# Patient Record
Sex: Female | Born: 1965 | Hispanic: Yes | Marital: Married | Smoking: Former smoker
Health system: Southern US, Community
[De-identification: ages and names within clinical notes are randomized; demographics above are authoritative.]

## PROBLEM LIST (undated history)

## (undated) DIAGNOSIS — D649 Anemia, unspecified: Secondary | ICD-10-CM

## (undated) DIAGNOSIS — M1990 Unspecified osteoarthritis, unspecified site: Secondary | ICD-10-CM

## (undated) HISTORY — PX: OTHER SURGICAL HISTORY: SHX169

## (undated) HISTORY — PX: BREAST REDUCTION SURGERY: SHX8

## (undated) HISTORY — PX: BREAST BIOPSY: SHX20

## (undated) HISTORY — PX: APPENDECTOMY: SHX54

## (undated) HISTORY — PX: WISDOM TOOTH EXTRACTION: SHX21

## (undated) HISTORY — PX: JOINT REPLACEMENT: SHX530

---

## 2018-02-22 DIAGNOSIS — G8929 Other chronic pain: Secondary | ICD-10-CM | POA: Insufficient documentation

## 2018-03-30 DIAGNOSIS — M25552 Pain in left hip: Secondary | ICD-10-CM | POA: Insufficient documentation

## 2018-07-05 DIAGNOSIS — M25859 Other specified joint disorders, unspecified hip: Secondary | ICD-10-CM | POA: Insufficient documentation

## 2018-09-29 DIAGNOSIS — D72829 Elevated white blood cell count, unspecified: Secondary | ICD-10-CM | POA: Insufficient documentation

## 2018-09-29 DIAGNOSIS — R079 Chest pain, unspecified: Secondary | ICD-10-CM | POA: Insufficient documentation

## 2018-09-29 DIAGNOSIS — I10 Essential (primary) hypertension: Secondary | ICD-10-CM | POA: Insufficient documentation

## 2019-10-19 ENCOUNTER — Ambulatory Visit (INDEPENDENT_AMBULATORY_CARE_PROVIDER_SITE_OTHER): Payer: Managed Care, Other (non HMO) | Admitting: Orthopaedic Surgery

## 2019-10-19 ENCOUNTER — Ambulatory Visit (INDEPENDENT_AMBULATORY_CARE_PROVIDER_SITE_OTHER): Payer: Managed Care, Other (non HMO)

## 2019-10-19 VITALS — Ht 59.0 in | Wt 140.0 lb

## 2019-10-19 DIAGNOSIS — M1611 Unilateral primary osteoarthritis, right hip: Secondary | ICD-10-CM | POA: Insufficient documentation

## 2019-10-19 DIAGNOSIS — M1612 Unilateral primary osteoarthritis, left hip: Secondary | ICD-10-CM | POA: Diagnosis not present

## 2019-10-19 DIAGNOSIS — M25559 Pain in unspecified hip: Secondary | ICD-10-CM

## 2019-10-19 NOTE — Progress Notes (Signed)
Office Visit Note   Patient: Joyce Alvarez           Date of Birth: [birth year]           MRN: [id number] Visit Date: 10/19/2019              Requested by: No referring provider defined for this encounter. PCP: [facility]   Assessment & Plan: Visit Diagnoses:  1. Hip pain   2. Unilateral primary osteoarthritis, left hip   3. Unilateral primary osteoarthritis, right hip     Plan: I talked to the patient in detail but her x-rays and her clinical exam.  I am recommending a left total hip arthroplasty through direct anterior approach.  I talked in detail about what the surgery involves.  We discussed the risk and benefits of surgeries.  We talked about her nonoperative and operative course.  We talked about the risk and benefits of surgery in detail in terms of what to expect.  Given the failure of conservative treatment over 12 months and given recommendations by another surgeon as well as needed hip replacement be useful and helpful, I feel that this is a neck step for her as does she.  Given her x-ray findings and her clinical exam she is having enough pain and discomfort to warrant this surgery.  All question concerns were answered and addressed.  We will work on getting it scheduled in the near future hopefully.  Follow-Up Instructions: Return for 2 weeks post-op.   Orders:  Orders Placed This Encounter  Procedures  . XR HIPS BILAT W OR W/O PELVIS 2V   No orders of the defined types were placed in this encounter.     Procedures: No procedures performed   Clinical Data: No additional findings.   Subjective: Chief Complaint  Patient presents with  . Left Hip - Pain  . Right Hip - Pain  The patient is from seeing for the first time.  She has bilateral hip pain with the left worse than right.  This is mainly in the groin on both sides.  She has a history of a left hip arthroscopy with a labral repair done in March 2020.  She has had worsening pain  since then.  She saw her orthopedic surgeon in [place] where she is from.  At this point he recommended hip replacement.  She is come to see me all the way from [place] since I have operated on a friend of hers.  She is not a diabetic and not a smoker.  She is very active but only 54 years old.  Her pain is daily and at this point is detrimentally affecting her mobility, her quality of life and her actives daily living.  She has failed conservative treatment for well over a year now.  Both hips hurt with weightbearing activities.  Both hips hurt with putting her shoes and socks on and with pivoting.  HPI  Review of Systems There is currently no listed headache, chest pain, shortness of breath, fever, chills, nausea, vomiting  Objective: Vital Signs: Ht 4\' 11"  (1.499 m)   Wt 140 lb (63.5 kg)   BMI 28.28 kg/m   Physical Exam She is alert and orient x3 and in no acute distress Ortho Exam Examination of both hips shows significant pain in the groin with internal and external rotation as well as stiffness with rotation. Specialty Comments:  No specialty comments available.  Imaging: XR HIPS BILAT W OR W/O PELVIS  2V  Result Date: 10/19/2019 An AP pelvis and lateral of both hips shows arthritic changes in both hips with the left worse than the right.  Both sides have peritracheal osteophytes with joint space narrowing.  The lateral aspect of the left hip shows a large particular osteophyte at the femoral head with some slight flattening as well.    PMFS History: Patient Active Problem List   Diagnosis Date Noted  . Unilateral primary osteoarthritis, left hip 10/19/2019  . Unilateral primary osteoarthritis, right hip 10/19/2019   No past medical history on file.  No family history on file.   Social History   Occupational History  . Not on file  Tobacco Use  . Smoking status: Not on file  Substance and Sexual Activity  . Alcohol use: Not on file  . Drug use: Not on file  . Sexual  activity: Not on file

## 2019-10-20 ENCOUNTER — Other Ambulatory Visit: Payer: Self-pay

## 2019-11-08 NOTE — Patient Instructions (Addendum)
DUE TO COVID-19 ONLY ONE VISITOR IS ALLOWED TO COME WITH YOU AND STAY IN THE WAITING ROOM ONLY DURING PRE  OP AND PROCEDURE.   IF YOU WILL BE ADMITTED INTO THE HOSPITAL YOU ARE ALLOWED ONE SUPPORT PERSON DURING VISITATION HOURS ONLY  (10AM -8PM)   . The support person may change daily. . The support person must pass our screening, gel in and out, and wear a mask at all times, including in the patient's room. . Patients must also wear a mask when staff or their support person are in the room.   COVID SWAB TESTING MUST BE COMPLETED ON:  Tuesday, 11-15-19 @  11:00 AM    [address] [place], [place] [address]  (Must self quarantine after testing. Follow instructions on handout.)   Your procedure is scheduled on: Friday, 11-18-2019   Report to [facility] Main  Entrance   Report to admitting at 9:45 AM   Call this number if you have problems the morning of surgery [phone]   Do not eat food :After Midnight.   May have liquids until 9:00 AM  day of surgery  CLEAR LIQUID DIET  Foods Allowed                                                                     Foods Excluded  Water, Black Coffee and tea, regular and decaf             liquids that you cannot  Plain Jell-O in any flavor  (No red)                                    see through such as: Fruit ices (not with fruit pulp)                                      milk, soups, orange juice              Iced Popsicles (No red)                                      All solid food                                   Apple juices Sports drinks like Gatorade (No red) Lightly seasoned clear broth or consume(fat free) Sugar, honey syrup      Oral Hygiene is also important to reduce your risk of infection.                                    Remember - BRUSH YOUR TEETH THE MORNING OF SURGERY WITH YOUR REGULAR TOOTHPASTE   Do NOT smoke after Midnight   Take these medicines the morning of surgery with A SIP OF WATER:  None  You may not have any metal on your body including hair pins, jewelry, and body piercings             Do not wear make-up, lotions, powders, perfumes/cologne, or deodorant             Do not wear nail polish.  Do not shave  48 hours prior to surgery.                 Do not bring valuables to the hospital. [facility] IS NOT RESPONSIBLE   FOR VALUABLES.   Contacts, dentures or bridgework may not be worn into surgery.   Bring small overnight bag day of surgery.                 Please read over the following fact sheets you were given: IF YOU HAVE QUESTIONS ABOUT YOUR PRE OP INSTRUCTIONS PLEASE  CALL [phone]   [facility] - Preparing for Surgery Before surgery, you can play an important role.  Because skin is not sterile, your skin needs to be as free of germs as possible.  You can reduce the number of germs on your skin by washing with CHG (chlorahexidine gluconate) soap before surgery.  CHG is an antiseptic cleaner which kills germs and bonds with the skin to continue killing germs even after washing. Please DO NOT use if you have an allergy to CHG or antibacterial soaps.  If your skin becomes reddened/irritated stop using the CHG and inform your nurse when you arrive at Short Stay. Do not shave (including legs and underarms) for at least 48 hours prior to the first CHG shower.  You may shave your face/neck.  Please follow these instructions carefully:  1.  Shower with CHG Soap the night before surgery and the  morning of surgery.  2.  If you choose to wash your hair, wash your hair first as usual with your normal  shampoo.  3.  After you shampoo, rinse your hair and body thoroughly to remove the shampoo.                             4.  Use CHG as you would any other liquid soap.  You can apply chg directly to the skin and wash.  Gently with a scrungie or clean washcloth.  5.  Apply the CHG Soap to your body ONLY FROM THE NECK DOWN.   Do   not use on face/  open                           Wound or open sores. Avoid contact with eyes, ears mouth and   genitals (private parts).                       Wash face,  Genitals (private parts) with your normal soap.             6.  Wash thoroughly, paying special attention to the area where your    surgery  will be performed.  7.  Thoroughly rinse your body with warm water from the neck down.  8.  DO NOT shower/wash with your normal soap after using and rinsing off the CHG Soap.                9.  Pat yourself dry with a clean towel.  10.  Wear clean pajamas.            11.  Place clean sheets on your bed the night of your first shower and do not  sleep with pets. Day of Surgery : Do not apply any lotions/deodorants the morning of surgery.  Please wear clean clothes to the hospital/surgery center.  FAILURE TO FOLLOW THESE INSTRUCTIONS MAY RESULT IN THE CANCELLATION OF YOUR SURGERY  PATIENT SIGNATURE_________________________________  NURSE SIGNATURE__________________________________  ________________________________________________________________________

## 2019-11-08 NOTE — Progress Notes (Addendum)
COVID Vaccine Completed: x2 Date COVID Vaccine completed: 06-03-19 & 06-22-19  COVID vaccine manufacturer: Pfizer    Moderna   [employer]   PCP - No regular PCP Cardiologist - N/A  Chest x-ray -  EKG -  Stress Test -  ECHO - Stress Echo 09-30-2018 due to chest pain.  Workup neg, attributed to stress Cardiac Cath -   Sleep Study -  CPAP -   Fasting Blood Sugar -  Checks Blood Sugar _____ times a day  Blood Thinner Instructions: Aspirin Instructions: Last Dose:  Anesthesia review:   Patient denies shortness of breath, fever, cough and chest pain at PAT appointment   Patient verbalized understanding of instructions that were given to them at the PAT appointment. Patient was also instructed that they will need to review over the PAT instructions again at home before surgery.

## 2019-11-08 NOTE — Progress Notes (Signed)
Please enter orders for PAT visit 11-11-19.  Patient scheduled for surgery 11-18-19.  Thank you

## 2019-11-11 ENCOUNTER — Other Ambulatory Visit: Payer: Self-pay

## 2019-11-11 ENCOUNTER — Encounter (HOSPITAL_COMMUNITY)
Admission: RE | Admit: 2019-11-11 | Discharge: 2019-11-11 | Disposition: A | Discharge status: Home / Self Care | Payer: Managed Care, Other (non HMO) | Source: Ambulatory Visit | Attending: Orthopaedic Surgery | Admitting: Orthopaedic Surgery

## 2019-11-11 ENCOUNTER — Encounter (HOSPITAL_COMMUNITY): Payer: Self-pay

## 2019-11-11 DIAGNOSIS — Z01812 Encounter for preprocedural laboratory examination: Secondary | ICD-10-CM | POA: Insufficient documentation

## 2019-11-11 HISTORY — DX: Anemia, unspecified: D64.9

## 2019-11-11 HISTORY — DX: Unspecified osteoarthritis, unspecified site: M19.90

## 2019-11-11 LAB — CBC
HCT: 39.8 % (ref 36.0–46.0)
Hemoglobin: 13 g/dL (ref 12.0–15.0)
MCH: 30.2 pg (ref 26.0–34.0)
MCHC: 32.7 g/dL (ref 30.0–36.0)
MCV: 92.6 fL (ref 80.0–100.0)
Platelets: 345 10*3/uL (ref 150–400)
RBC: 4.3 MIL/uL (ref 3.87–5.11)
RDW: 13 % (ref 11.5–15.5)
WBC: 12.4 10*3/uL — ABNORMAL HIGH (ref 4.0–10.5)
nRBC: 0 % (ref 0.0–0.2)

## 2019-11-11 LAB — SURGICAL PCR SCREEN
MRSA, PCR: NEGATIVE
Staphylococcus aureus: NEGATIVE

## 2019-11-11 NOTE — Progress Notes (Signed)
CBC sent to Dr. Blackman for review. 

## 2019-11-14 ENCOUNTER — Telehealth: Payer: Self-pay

## 2019-11-14 NOTE — Telephone Encounter (Signed)
Email from patient:  Hi! I am looking forward to my surgery Friday.  Both my hips are in bad shape, I was wondering if it will make sense to get an injection on the right hip so when the pressure of recovery (left hip) does not make it more difficult or have a more severe impact   I had an injection on my left hip early this year and it did have a temporary effect (not the Full 3 months they promised)   Can you consult with the doctor ?  Thanks    Zarya E. Bacchi [phone]

## 2019-11-14 NOTE — Telephone Encounter (Signed)
I am fine with adding to her consent an intra-articular steroid injection in her left hip that we can do in the OR under live x-ray.

## 2019-11-15 ENCOUNTER — Other Ambulatory Visit (HOSPITAL_COMMUNITY)
Admission: RE | Admit: 2019-11-15 | Discharge: 2019-11-15 | Disposition: A | Discharge status: Home / Self Care | Payer: Managed Care, Other (non HMO) | Source: Ambulatory Visit | Attending: Orthopaedic Surgery | Admitting: Orthopaedic Surgery

## 2019-11-15 ENCOUNTER — Other Ambulatory Visit: Payer: Self-pay | Admitting: Physician Assistant

## 2019-11-15 DIAGNOSIS — Z20822 Contact with and (suspected) exposure to covid-19: Secondary | ICD-10-CM | POA: Diagnosis not present

## 2019-11-15 DIAGNOSIS — Z01812 Encounter for preprocedural laboratory examination: Secondary | ICD-10-CM | POA: Diagnosis not present

## 2019-11-15 LAB — SARS CORONAVIRUS 2 (TAT 6-24 HRS): SARS Coronavirus 2: NEGATIVE

## 2019-11-15 NOTE — Telephone Encounter (Signed)
I added to surgery posting.  Joyce Alvarez will add to consent.

## 2019-11-17 ENCOUNTER — Encounter (HOSPITAL_COMMUNITY): Payer: Self-pay | Admitting: Orthopaedic Surgery

## 2019-11-17 NOTE — H&P (Signed)
TOTAL HIP ADMISSION H&P  Patient is admitted for left total hip arthroplasty.  Subjective:  Chief Complaint: left hip pain  HPI: Joyce Alvarez, 54 y.o. female, has a history of pain and functional disability in the left hip(s) due to arthritis and patient has failed non-surgical conservative treatments for greater than 12 weeks to include NSAID's and/or analgesics, corticosteriod injections, flexibility and strengthening excercises and activity modification.  Onset of symptoms was gradual starting 2 years ago with gradually worsening course since that time.The patient noted prior procedures of the hip to include arthroscopy on the left hip(s).  Patient currently rates pain in the left hip at 10 out of 10 with activity. Patient has night pain, worsening of pain with activity and weight bearing, pain that interfers with activities of daily living and pain with passive range of motion. Patient has evidence of subchondral cysts, subchondral sclerosis, periarticular osteophytes and joint space narrowing by imaging studies. This condition presents safety issues increasing the risk of falls.  There is no current active infection.  Patient Active Problem List   Diagnosis Date Noted  . Unilateral primary osteoarthritis, left hip 10/19/2019  . Unilateral primary osteoarthritis, right hip 10/19/2019   Past Medical History:  Diagnosis Date  . Anemia   . Arthritis     Past Surgical History:  Procedure Laterality Date  . APPENDECTOMY    . BREAST BIOPSY Right   . BREAST REDUCTION SURGERY    . caesarean section    . WISDOM TOOTH EXTRACTION      No current facility-administered medications for this encounter.   Current Outpatient Medications  Medication Sig Dispense Refill Last Dose  . acidophilus (RISAQUAD) CAPS capsule Take 1 capsule by mouth daily.     . Cholecalciferol (VITAMIN D) 50 MCG (2000 UT) CAPS Take 2,000 Units by mouth daily.     . COLLAGEN PO Take 1 capsule by mouth daily.     .  Glucosamine-Chondroitin (OSTEO BI-FLEX REGULAR STRENGTH PO) Take 2 tablets by mouth daily.     . Magnesium 250 MG TABS Take 250 mg by mouth daily.     . Multiple Vitamin (MULTIVITAMIN WITH MINERALS) TABS tablet Take 1 tablet by mouth daily.     . Omega-3 1000 MG CAPS Take 1,000 mg by mouth daily.      No Known Allergies  Social History   Tobacco Use  . Smoking status: Former Smoker    Packs/day: 0.25    Years: 10.00    Pack years: 2.50  . Smokeless tobacco: Never Used  . Tobacco comment: Quit 1995  Substance Use Topics  . Alcohol use: Yes    Comment: Social    History reviewed. No pertinent family history.   Review of Systems  All other systems reviewed and are negative.   Objective:  Physical Exam Vitals reviewed.  Constitutional:      Appearance: Normal appearance.  HENT:     Head: Normocephalic and atraumatic.  Eyes:     Extraocular Movements: Extraocular movements intact.     Pupils: Pupils are equal, round, and reactive to light.  Cardiovascular:     Rate and Rhythm: Normal rate and regular rhythm.     Pulses: Normal pulses.  Pulmonary:     Effort: Pulmonary effort is normal.     Breath sounds: Normal breath sounds.  Abdominal:     Palpations: Abdomen is soft.  Musculoskeletal:     Cervical back: Normal range of motion.     Left hip: Tenderness and  bony tenderness present. Decreased range of motion. Decreased strength.  Neurological:     Mental Status: She is alert and oriented to person, place, and time.  Psychiatric:        Behavior: Behavior normal.     Vital signs in last 24 hours:    Labs:   Estimated body mass index is 31.1 kg/m as calculated from the following:   Height as of 11/11/19: 4\' 11"  (1.499 m).   Weight as of 11/11/19: 69.9 kg.   Imaging Review Plain radiographs demonstrate severe degenerative joint disease of the left hip(s). The bone quality appears to be excellent for age and reported activity  level.      Assessment/Plan:  End stage arthritis, left hip(s)  The patient history, physical examination, clinical judgement of the provider and imaging studies are consistent with end stage degenerative joint disease of the left hip(s) and total hip arthroplasty is deemed medically necessary. The treatment options including medical management, injection therapy, arthroscopy and arthroplasty were discussed at length. The risks and benefits of total hip arthroplasty were presented and reviewed. The risks due to aseptic loosening, infection, stiffness, dislocation/subluxation,  thromboembolic complications and other imponderables were discussed.  The patient acknowledged the explanation, agreed to proceed with the plan and consent was signed. Patient is being admitted for inpatient treatment for surgery, pain control, PT, OT, prophylactic antibiotics, VTE prophylaxis, progressive ambulation and ADL's and discharge planning.The patient is planning to be discharged home with home health services

## 2019-11-18 ENCOUNTER — Encounter (HOSPITAL_COMMUNITY): Payer: Self-pay | Admitting: Orthopaedic Surgery

## 2019-11-18 ENCOUNTER — Other Ambulatory Visit: Payer: Self-pay

## 2019-11-18 ENCOUNTER — Observation Stay (HOSPITAL_COMMUNITY): Payer: Managed Care, Other (non HMO)

## 2019-11-18 ENCOUNTER — Ambulatory Visit (HOSPITAL_COMMUNITY): Payer: Managed Care, Other (non HMO) | Admitting: Anesthesiology

## 2019-11-18 ENCOUNTER — Observation Stay (HOSPITAL_COMMUNITY)
Admission: RE | Admit: 2019-11-18 | Discharge: 2019-11-19 | Disposition: A | Discharge status: Home / Self Care | Payer: Managed Care, Other (non HMO) | Source: Ambulatory Visit | Attending: Orthopaedic Surgery | Admitting: Orthopaedic Surgery

## 2019-11-18 ENCOUNTER — Encounter (HOSPITAL_COMMUNITY)
Admission: RE | Disposition: A | Discharge status: Home / Self Care | Payer: Self-pay | Source: Ambulatory Visit | Attending: Orthopaedic Surgery

## 2019-11-18 ENCOUNTER — Ambulatory Visit (HOSPITAL_COMMUNITY): Payer: Managed Care, Other (non HMO)

## 2019-11-18 DIAGNOSIS — Z87891 Personal history of nicotine dependence: Secondary | ICD-10-CM | POA: Insufficient documentation

## 2019-11-18 DIAGNOSIS — Z419 Encounter for procedure for purposes other than remedying health state, unspecified: Secondary | ICD-10-CM

## 2019-11-18 DIAGNOSIS — Z96642 Presence of left artificial hip joint: Secondary | ICD-10-CM

## 2019-11-18 DIAGNOSIS — M1612 Unilateral primary osteoarthritis, left hip: Principal | ICD-10-CM | POA: Insufficient documentation

## 2019-11-18 HISTORY — PX: STERIOD INJECTION: SHX5046

## 2019-11-18 HISTORY — PX: TOTAL HIP ARTHROPLASTY: SHX124

## 2019-11-18 LAB — TYPE AND SCREEN
ABO/RH(D): O POS
Antibody Screen: NEGATIVE

## 2019-11-18 LAB — ABO/RH: ABO/RH(D): O POS

## 2019-11-18 LAB — PREGNANCY, URINE: Preg Test, Ur: NEGATIVE

## 2019-11-18 SURGERY — ARTHROPLASTY, HIP, TOTAL, ANTERIOR APPROACH
Anesthesia: Spinal | Site: Hip | Laterality: Right

## 2019-11-18 MED ORDER — BUPIVACAINE IN DEXTROSE 0.75-8.25 % IT SOLN
INTRATHECAL | Status: DC | PRN
  Administered 2019-11-18: 1.8 mL via INTRATHECAL

## 2019-11-18 MED ORDER — SODIUM CHLORIDE 0.9 % IR SOLN
Status: DC | PRN
  Administered 2019-11-18: 1000 mL

## 2019-11-18 MED ORDER — HYDROMORPHONE HCL 1 MG/ML IJ SOLN
0.2500 mg | INTRAMUSCULAR | Status: DC | PRN
  Administered 2019-11-18 (×2): 0.5 mg via INTRAVENOUS

## 2019-11-18 MED ORDER — HYDROMORPHONE HCL 1 MG/ML IJ SOLN
INTRAMUSCULAR | Status: AC
  Filled 2019-11-18: qty 1

## 2019-11-18 MED ORDER — ONDANSETRON HCL 4 MG/2ML IJ SOLN
INTRAMUSCULAR | Status: DC | PRN
  Administered 2019-11-18: 4 mg via INTRAVENOUS

## 2019-11-18 MED ORDER — STERILE WATER FOR IRRIGATION IR SOLN
Status: DC | PRN
  Administered 2019-11-18: 2000 mL

## 2019-11-18 MED ORDER — PANTOPRAZOLE SODIUM 40 MG PO TBEC
40.0000 mg | DELAYED_RELEASE_TABLET | Freq: Every day | ORAL | Status: DC
  Administered 2019-11-18 – 2019-11-19 (×2): 40 mg via ORAL
  Filled 2019-11-18 (×2): qty 1

## 2019-11-18 MED ORDER — VITAMIN D 50 MCG (2000 UT) PO CAPS: 2000.0000 [IU] | ORAL_CAPSULE | Freq: Every day | ORAL | Status: DC

## 2019-11-18 MED ORDER — DIPHENHYDRAMINE HCL 12.5 MG/5ML PO ELIX: 12.5000 mg | ORAL_SOLUTION | ORAL | Status: DC | PRN

## 2019-11-18 MED ORDER — DOCUSATE SODIUM 100 MG PO CAPS
100.0000 mg | ORAL_CAPSULE | Freq: Two times a day (BID) | ORAL | Status: DC
  Administered 2019-11-18 – 2019-11-19 (×2): 100 mg via ORAL
  Filled 2019-11-18 (×2): qty 1

## 2019-11-18 MED ORDER — FENTANYL CITRATE (PF) 100 MCG/2ML IJ SOLN
INTRAMUSCULAR | Status: AC
  Filled 2019-11-18: qty 2

## 2019-11-18 MED ORDER — PROMETHAZINE HCL 25 MG/ML IJ SOLN: 6.2500 mg | INTRAMUSCULAR | Status: DC | PRN

## 2019-11-18 MED ORDER — ORAL CARE MOUTH RINSE
15.0000 mL | Freq: Once | OROMUCOSAL | Status: AC
  Administered 2019-11-18: 15 mL via OROMUCOSAL

## 2019-11-18 MED ORDER — METHOCARBAMOL 500 MG IVPB - SIMPLE MED
500.0000 mg | Freq: Four times a day (QID) | INTRAVENOUS | Status: DC | PRN
  Administered 2019-11-18: 500 mg via INTRAVENOUS
  Filled 2019-11-18: qty 50

## 2019-11-18 MED ORDER — TRANEXAMIC ACID-NACL 1000-0.7 MG/100ML-% IV SOLN
1000.0000 mg | INTRAVENOUS | Status: AC
  Administered 2019-11-18: 1000 mg via INTRAVENOUS
  Filled 2019-11-18: qty 100

## 2019-11-18 MED ORDER — MAGNESIUM OXIDE 400 (241.3 MG) MG PO TABS
200.0000 mg | ORAL_TABLET | Freq: Every day | ORAL | Status: DC
  Administered 2019-11-19: 200 mg via ORAL
  Filled 2019-11-18: qty 1

## 2019-11-18 MED ORDER — MIDAZOLAM HCL 2 MG/2ML IJ SOLN
INTRAMUSCULAR | Status: AC
  Filled 2019-11-18: qty 2

## 2019-11-18 MED ORDER — ACETAMINOPHEN 325 MG PO TABS: 325.0000 mg | ORAL_TABLET | Freq: Four times a day (QID) | ORAL | Status: DC | PRN

## 2019-11-18 MED ORDER — CHLORHEXIDINE GLUCONATE 0.12 % MT SOLN: 15.0000 mL | Freq: Once | OROMUCOSAL | Status: AC

## 2019-11-18 MED ORDER — LACTATED RINGERS IV SOLN: INTRAVENOUS | Status: DC

## 2019-11-18 MED ORDER — GABAPENTIN 100 MG PO CAPS
100.0000 mg | ORAL_CAPSULE | Freq: Three times a day (TID) | ORAL | Status: DC
  Administered 2019-11-18 – 2019-11-19 (×3): 100 mg via ORAL
  Filled 2019-11-18 (×3): qty 1

## 2019-11-18 MED ORDER — CEFAZOLIN SODIUM-DEXTROSE 1-4 GM/50ML-% IV SOLN
1.0000 g | Freq: Four times a day (QID) | INTRAVENOUS | Status: AC
  Administered 2019-11-18 – 2019-11-19 (×2): 1 g via INTRAVENOUS
  Filled 2019-11-18 (×3): qty 50

## 2019-11-18 MED ORDER — POLYETHYLENE GLYCOL 3350 17 G PO PACK: 17.0000 g | PACK | Freq: Every day | ORAL | Status: DC | PRN

## 2019-11-18 MED ORDER — MENTHOL 3 MG MT LOZG: 1.0000 | LOZENGE | OROMUCOSAL | Status: DC | PRN

## 2019-11-18 MED ORDER — RISAQUAD PO CAPS
1.0000 | ORAL_CAPSULE | Freq: Every day | ORAL | Status: DC
  Administered 2019-11-19: 1 via ORAL
  Filled 2019-11-18: qty 1

## 2019-11-18 MED ORDER — BUPIVACAINE HCL 0.25 % IJ SOLN
INTRAMUSCULAR | Status: AC
  Filled 2019-11-18: qty 1

## 2019-11-18 MED ORDER — ONDANSETRON HCL 4 MG/2ML IJ SOLN: 4.0000 mg | Freq: Four times a day (QID) | INTRAMUSCULAR | Status: DC | PRN

## 2019-11-18 MED ORDER — HYDROMORPHONE HCL 1 MG/ML IJ SOLN: 0.5000 mg | INTRAMUSCULAR | Status: DC | PRN

## 2019-11-18 MED ORDER — FENTANYL CITRATE (PF) 100 MCG/2ML IJ SOLN
INTRAMUSCULAR | Status: DC | PRN
Start: 2019-11-18 — End: 2019-11-18
  Administered 2019-11-18: 50 ug via INTRAVENOUS

## 2019-11-18 MED ORDER — 0.9 % SODIUM CHLORIDE (POUR BTL) OPTIME
TOPICAL | Status: DC | PRN
  Administered 2019-11-18: 1000 mL

## 2019-11-18 MED ORDER — VITAMIN D 25 MCG (1000 UNIT) PO TABS
2000.0000 [IU] | ORAL_TABLET | Freq: Every day | ORAL | Status: DC
  Administered 2019-11-19: 2000 [IU] via ORAL
  Filled 2019-11-18: qty 2

## 2019-11-18 MED ORDER — METHYLPREDNISOLONE ACETATE 40 MG/ML IJ SUSP
INTRAMUSCULAR | Status: DC | PRN
  Administered 2019-11-18: 40 mg via INTRA_ARTICULAR

## 2019-11-18 MED ORDER — ALUM & MAG HYDROXIDE-SIMETH 200-200-20 MG/5ML PO SUSP: 30.0000 mL | ORAL | Status: DC | PRN

## 2019-11-18 MED ORDER — METHOCARBAMOL 500 MG IVPB - SIMPLE MED
INTRAVENOUS | Status: AC
  Filled 2019-11-18: qty 50

## 2019-11-18 MED ORDER — SODIUM CHLORIDE 0.9 % IV SOLN: INTRAVENOUS | Status: DC

## 2019-11-18 MED ORDER — OXYCODONE HCL 5 MG PO TABS: 5.0000 mg | ORAL_TABLET | Freq: Once | ORAL | Status: DC | PRN

## 2019-11-18 MED ORDER — PHENOL 1.4 % MT LIQD: 1.0000 | OROMUCOSAL | Status: DC | PRN

## 2019-11-18 MED ORDER — CEFAZOLIN SODIUM-DEXTROSE 2-4 GM/100ML-% IV SOLN
2.0000 g | INTRAVENOUS | Status: AC
  Administered 2019-11-18: 2 g via INTRAVENOUS
  Filled 2019-11-18: qty 100

## 2019-11-18 MED ORDER — DEXAMETHASONE SODIUM PHOSPHATE 10 MG/ML IJ SOLN
INTRAMUSCULAR | Status: DC | PRN
  Administered 2019-11-18: 8 mg via INTRAVENOUS

## 2019-11-18 MED ORDER — BUPIVACAINE HCL (PF) 0.25 % IJ SOLN
INTRAMUSCULAR | Status: DC | PRN
  Administered 2019-11-18: 3 mL via INTRA_ARTICULAR

## 2019-11-18 MED ORDER — ADULT MULTIVITAMIN W/MINERALS CH
1.0000 | ORAL_TABLET | Freq: Every day | ORAL | Status: DC
  Administered 2019-11-19: 1 via ORAL
  Filled 2019-11-18: qty 1

## 2019-11-18 MED ORDER — METOCLOPRAMIDE HCL 5 MG PO TABS: 5.0000 mg | ORAL_TABLET | Freq: Three times a day (TID) | ORAL | Status: DC | PRN

## 2019-11-18 MED ORDER — OXYCODONE HCL 5 MG/5ML PO SOLN: 5.0000 mg | Freq: Once | ORAL | Status: DC | PRN

## 2019-11-18 MED ORDER — LIDOCAINE 2% (20 MG/ML) 5 ML SYRINGE
INTRAMUSCULAR | Status: DC | PRN
  Administered 2019-11-18: 50 mg via INTRAVENOUS
  Administered 2019-11-18: 30 mg via INTRAVENOUS

## 2019-11-18 MED ORDER — ONDANSETRON HCL 4 MG PO TABS: 4.0000 mg | ORAL_TABLET | Freq: Four times a day (QID) | ORAL | Status: DC | PRN

## 2019-11-18 MED ORDER — POVIDONE-IODINE 10 % EX SWAB
2.0000 "application " | Freq: Once | CUTANEOUS | Status: AC
  Administered 2019-11-18: 2 via TOPICAL

## 2019-11-18 MED ORDER — ASPIRIN 81 MG PO CHEW
81.0000 mg | CHEWABLE_TABLET | Freq: Two times a day (BID) | ORAL | Status: DC
  Administered 2019-11-18 – 2019-11-19 (×2): 81 mg via ORAL
  Filled 2019-11-18 (×2): qty 1

## 2019-11-18 MED ORDER — ACETAMINOPHEN 500 MG PO TABS
1000.0000 mg | ORAL_TABLET | Freq: Once | ORAL | Status: AC
  Administered 2019-11-18: 1000 mg via ORAL
  Filled 2019-11-18: qty 2

## 2019-11-18 MED ORDER — METHOCARBAMOL 500 MG PO TABS
500.0000 mg | ORAL_TABLET | Freq: Four times a day (QID) | ORAL | Status: DC | PRN
  Administered 2019-11-19: 500 mg via ORAL
  Filled 2019-11-18: qty 1

## 2019-11-18 MED ORDER — MIDAZOLAM HCL 5 MG/5ML IJ SOLN
INTRAMUSCULAR | Status: DC | PRN
  Administered 2019-11-18: 2 mg via INTRAVENOUS

## 2019-11-18 MED ORDER — OXYCODONE HCL 5 MG PO TABS
5.0000 mg | ORAL_TABLET | ORAL | Status: DC | PRN
  Administered 2019-11-18 – 2019-11-19 (×2): 10 mg via ORAL
  Administered 2019-11-19: 5 mg via ORAL
  Administered 2019-11-19: 10 mg via ORAL
  Filled 2019-11-18 (×5): qty 2

## 2019-11-18 MED ORDER — METOCLOPRAMIDE HCL 5 MG/ML IJ SOLN: 5.0000 mg | Freq: Three times a day (TID) | INTRAMUSCULAR | Status: DC | PRN

## 2019-11-18 MED ORDER — METHYLPREDNISOLONE ACETATE 40 MG/ML IJ SUSP
INTRAMUSCULAR | Status: AC
  Filled 2019-11-18: qty 1

## 2019-11-18 MED ORDER — OXYCODONE HCL 5 MG PO TABS
10.0000 mg | ORAL_TABLET | ORAL | Status: DC | PRN
  Administered 2019-11-18: 10 mg via ORAL

## 2019-11-18 MED ORDER — PROPOFOL 10 MG/ML IV BOLUS
INTRAVENOUS | Status: DC | PRN
  Administered 2019-11-18: 55 ug/kg/min via INTRAVENOUS
  Administered 2019-11-18: 20 mg via INTRAVENOUS

## 2019-11-18 MED ORDER — MAGNESIUM 250 MG PO TABS: 250.0000 mg | ORAL_TABLET | Freq: Every day | ORAL | Status: DC

## 2019-11-18 SURGICAL SUPPLY — 40 items
BAG ZIPLOCK 12X15 (MISCELLANEOUS) IMPLANT
BENZOIN TINCTURE PRP APPL 2/3 (GAUZE/BANDAGES/DRESSINGS) IMPLANT
BLADE SAW SGTL 18X1.27X75 (BLADE) ×3 IMPLANT
BLADE SAW SGTL 18X1.27X75MM (BLADE) ×1
CLOSURE WOUND 1/2 X4 (GAUZE/BANDAGES/DRESSINGS)
COVER PERINEAL POST (MISCELLANEOUS) ×4 IMPLANT
COVER SURGICAL LIGHT HANDLE (MISCELLANEOUS) ×4 IMPLANT
COVER WAND RF STERILE (DRAPES) ×4 IMPLANT
CUP ACET PINNACLE SECTR 48MM (Joint) ×2 IMPLANT
DRAPE STERI IOBAN 125X83 (DRAPES) ×4 IMPLANT
DRAPE U-SHAPE 47X51 STRL (DRAPES) ×8 IMPLANT
DRSG AQUACEL AG ADV 3.5X10 (GAUZE/BANDAGES/DRESSINGS) ×4 IMPLANT
DURAPREP 26ML APPLICATOR (WOUND CARE) ×4 IMPLANT
ELECT REM PT RETURN 15FT ADLT (MISCELLANEOUS) ×4 IMPLANT
GAUZE XEROFORM 1X8 LF (GAUZE/BANDAGES/DRESSINGS) ×4 IMPLANT
GLOVE BIO SURGEON STRL SZ7.5 (GLOVE) ×4 IMPLANT
GLOVE BIOGEL PI IND STRL 8 (GLOVE) ×4 IMPLANT
GLOVE BIOGEL PI INDICATOR 8 (GLOVE) ×4
GLOVE ECLIPSE 8.0 STRL XLNG CF (GLOVE) ×4 IMPLANT
GOWN STRL REUS W/TWL XL LVL3 (GOWN DISPOSABLE) ×8 IMPLANT
HANDPIECE INTERPULSE COAX TIP (DISPOSABLE) ×2
HEAD FEMORAL 32 CERAMIC (Hips) ×4 IMPLANT
HOLDER FOLEY CATH W/STRAP (MISCELLANEOUS) ×4 IMPLANT
KIT TURNOVER KIT A (KITS) IMPLANT
LINER ACET 32X48 (Liner) ×4 IMPLANT
PACK ANTERIOR HIP CUSTOM (KITS) ×4 IMPLANT
PENCIL SMOKE EVACUATOR (MISCELLANEOUS) IMPLANT
PINNSECTOR W/GRIP ACE CUP 48MM (Joint) ×4 IMPLANT
SET HNDPC FAN SPRY TIP SCT (DISPOSABLE) ×2 IMPLANT
STAPLER VISISTAT 35W (STAPLE) IMPLANT
STEM FEM ACTIS STD SZ0 (Stem) ×4 IMPLANT
STRIP CLOSURE SKIN 1/2X4 (GAUZE/BANDAGES/DRESSINGS) IMPLANT
SUT ETHIBOND NAB CT1 #1 30IN (SUTURE) ×4 IMPLANT
SUT ETHILON 2 0 PS N (SUTURE) IMPLANT
SUT MNCRL AB 4-0 PS2 18 (SUTURE) IMPLANT
SUT VIC AB 0 CT1 36 (SUTURE) ×4 IMPLANT
SUT VIC AB 1 CT1 36 (SUTURE) ×4 IMPLANT
SUT VIC AB 2-0 CT1 27 (SUTURE) ×4
SUT VIC AB 2-0 CT1 TAPERPNT 27 (SUTURE) ×4 IMPLANT
TRAY FOLEY MTR SLVR 16FR STAT (SET/KITS/TRAYS/PACK) IMPLANT

## 2019-11-18 NOTE — Anesthesia Preprocedure Evaluation (Addendum)
Anesthesia Evaluation  Patient identified by MRN, date of birth, ID band Patient awake    Reviewed: Allergy & Precautions, NPO status , Patient's Chart, lab work & pertinent test results  Airway Mallampati: II  TM Distance: >3 FB Neck ROM: Full    Dental no notable dental hx.    Pulmonary former smoker,    Pulmonary exam normal breath sounds clear to auscultation       Cardiovascular negative cardio ROS Normal cardiovascular exam Rhythm:Regular Rate:Normal     Neuro/Psych negative neurological ROS  negative psych ROS   GI/Hepatic negative GI ROS, Neg liver ROS,   Endo/Other  negative endocrine ROS  Renal/GU negative Renal ROS     Musculoskeletal  (+) Arthritis ,   Abdominal (+) + obese,   Peds  Hematology negative hematology ROS (+)   Anesthesia Other Findings left hip osteoarthritis right hip osteoarthritis  Reproductive/Obstetrics                            Anesthesia Physical Anesthesia Plan  ASA: I  Anesthesia Plan: Spinal   Post-op Pain Management:    Induction: Intravenous  PONV Risk Score and Plan: 2 and Ondansetron, Dexamethasone, Propofol infusion, Treatment may vary due to age or medical condition and Midazolam  Airway Management Planned: Simple Face Mask  Additional Equipment:   Intra-op Plan:   Post-operative Plan:   Informed Consent: I have reviewed the patients History and Physical, chart, labs and discussed the procedure including the risks, benefits and alternatives for the proposed anesthesia with the patient or authorized representative who has indicated his/her understanding and acceptance.     Dental advisory given  Plan Discussed with: CRNA  Anesthesia Plan Comments:         Anesthesia Quick Evaluation

## 2019-11-18 NOTE — Transfer of Care (Signed)
Immediate Anesthesia Transfer of Care Note  Patient: Joyce Alvarez  Procedure(s) Performed: Procedure(s): LEFT TOTAL HIP ARTHROPLASTY ANTERIOR APPROACH (Left) STEROID INJECTION right hip (Right)  Patient Location: PACU  Anesthesia Type:Spinal  Level of Consciousness: awake, alert  and oriented  Airway & Oxygen Therapy: Patient Spontanous Breathing  Post-op Assessment: Report given to RN and Post -op Vital signs reviewed and stable  Post vital signs: Reviewed and stable  Last Vitals:  Vitals:   11/18/19 1119  BP: 134/87  Pulse: 72  Resp: 18  Temp: 37.2 C  SpO2: 100%    Complications: No apparent anesthesia complications

## 2019-11-18 NOTE — Anesthesia Postprocedure Evaluation (Signed)
Anesthesia Post Note  Patient: Joyce Alvarez  Procedure(s) Performed: LEFT TOTAL HIP ARTHROPLASTY ANTERIOR APPROACH (Left Hip) STEROID INJECTION right hip (Right )     Patient location during evaluation: PACU Anesthesia Type: Spinal Level of consciousness: oriented and awake Pain management: pain level controlled Vital Signs Assessment: post-procedure vital signs reviewed and stable Respiratory status: spontaneous breathing, respiratory function stable and patient connected to nasal cannula oxygen Cardiovascular status: blood pressure returned to baseline and stable Postop Assessment: no headache, no backache, no apparent nausea or vomiting and spinal receding Anesthetic complications: no   No complications documented.  Last Vitals:  Vitals:   11/18/19 1700 11/18/19 1712  BP: 107/70 123/77  Pulse: 62 62  Resp: 12 14  Temp: 36.5 C 36.5 C  SpO2: 100% 100%    Last Pain:  Vitals:   11/18/19 1800  TempSrc:   PainSc: 7                  Leonel Mccollum P Ogechi Kuehnel

## 2019-11-18 NOTE — Anesthesia Procedure Notes (Signed)
Spinal  Patient location during procedure: OR Start time: 11/18/2019 1:50 PM End time: 11/18/2019 2:00 PM Staffing Performed: anesthesiologist  Anesthesiologist: Leonides Grills, MD Preanesthetic Checklist Completed: patient identified, IV checked, risks and benefits discussed, surgical consent, monitors and equipment checked, pre-op evaluation and timeout performed Spinal Block Patient position: sitting Prep: DuraPrep Patient monitoring: cardiac monitor, continuous pulse ox and blood pressure Approach: midline Location: L4-5 Injection technique: single-shot Needle Needle type: Pencan  Needle gauge: 24 G Needle length: 9 cm Assessment Sensory level: T10 Additional Notes Functioning IV was confirmed and monitors were applied. Sterile prep and drape, including hand hygiene and sterile gloves were used. The patient was positioned and the spine was prepped. The skin was anesthetized with lidocaine.  Free flow of clear CSF was obtained prior to injecting local anesthetic into the CSF.  The spinal needle aspirated freely following injection.  The needle was carefully withdrawn.  The patient tolerated the procedure well.

## 2019-11-18 NOTE — Brief Op Note (Signed)
11/18/2019  3:38 PM  PATIENT:  Joyce Alvarez  54 y.o. female  PRE-OPERATIVE DIAGNOSIS:  left hip osteoarthritis, right hip osteoarthritis  POST-OPERATIVE DIAGNOSIS:  left hip osteoarthritis, right hip osteoarthritis  PROCEDURE:  Procedure(s): LEFT TOTAL HIP ARTHROPLASTY ANTERIOR APPROACH (Left) STEROID INJECTION right hip (Right)  SURGEON:  Surgeon(s) and Role:    Kathryne Hitch, MD - Primary  PHYSICIAN ASSISTANT:  Rexene Edison, PA-C  ANESTHESIA:   spinal  EBL:  200 mL   DICTATION: .Other Dictation: Dictation Number [id number]  PLAN OF CARE: Admit for overnight observation  PATIENT DISPOSITION:  PACU - hemodynamically stable.   Delay start of Pharmacological VTE agent (>24hrs) due to surgical blood loss or risk of bleeding: no

## 2019-11-18 NOTE — Op Note (Signed)
NAMEMARSENA, Joyce Alvarez MEDICAL RECORD HQ:[id number] ACCOUNT [id number] DATE OF BIRTH:04/17/1965 FACILITY: [facility] LOCATION: [facility] PHYSICIAN:Qunicy Higinbotham Aretha Parrot, MD  OPERATIVE REPORT  DATE OF PROCEDURE:  11/18/2019  PREOPERATIVE DIAGNOSIS:  Primary osteoarthritis and degenerative joint disease, left hip.  POSTOPERATIVE DIAGNOSIS:  Primary osteoarthritis and degenerative joint disease, left hip.  PROCEDURE:  Left total hip arthroplasty through direct anterior approach.  IMPLANTS:  DePuy Sector Gription acetabular component size 48, size 32+0 neutral polyethylene liner, size 0 Actis femoral component with standard offset, size 32+1 ceramic hip ball.  SURGEON:  Vanita Panda.  Magnus Ivan, MD  ASSISTANT:  Richardean Canal, PA-C.  ANESTHESIA:  Spinal.  ANTIBIOTICS:  Two g IV Ancef.  ESTIMATED BLOOD LOSS:  200 mL.  INDICATIONS:  The patient is a very pleasant 54 year old female from [place], [place].  She has a history of a left hip arthroscopy and a labral repair.  Over time, her hip has gotten significantly painful to her and although her plain films did  not show significant arthritis, the MRI did show quite significant arthritis of her left hip.  She has severe pain with internal and external rotation of that left hip as well as weightbearing.  At this point, her left hip pain is detrimentally affected  her mobility, her quality of life and her activities of daily living to the point, she does wish to proceed with total hip arthroplasty.  She also has significant pain in her right hip and even has some arthritis in that hip  based on MRI.  At this  point, she does wish to proceed with total hip arthroplasty and we have her set up for that today.  Since she will be under spinal anesthesia, she would like for [place] to place a steroid injection in her right hip and I do feel this is reasonable under  direct fluoroscopy to do this today because she is not a diabetic and not someone who is  immunocompromised.  We had a long and thorough discussion about hip replacement surgery.  We talked about the risk of acute blood loss anemia, nerve or vessel  injury, fracture, infection, dislocation, DVT and implant failure and soft tissue issues.  We talked about our goals being decreased pain, improved mobility and overall improved quality of life.  DESCRIPTION OF PROCEDURE:  After informed consent was obtained, the appropriate left hip was marked for the surgery and right hip was marked for an injection.  She was brought to the operating room and sat up on a stretcher where spinal anesthesia was  then obtained.  She was then laid in supine position on a stretcher.  I was able to assess her leg lengths.  I then placed traction boots on both her feet.  Next, she was placed supine on the Hana fracture table with the perineal post in place and both  legs in line skeletal traction device and no traction applied.  I did have the staff draw an injection for her right hip.  We prepped that hip with just alcohol swabs and under direct fluoroscopy, I was able to provide an injection of 3 mL of 0.25% plain  Marcaine mixed with 1 mL of Depo-Medrol.  We placed a Band-Aid over this.  Attention was then turned to her left femur.  We assessed it again radiographically and then prepped her left hip with DuraPrep and sterile drapes.  A time-out was called.  She  was identified as correct patient, correct left hip for a total hip arthroplasty.  I then made my incision just inferior and posterior to the anterior superior iliac spine and carried this obliquely down the leg.  We dissected down to the tensor fascia  lata muscle.  Tensor fascia was then divided longitudinally to proceed with direct anterior approach to the hip.  We identified and cauterized circumflex vessels.  I then identified the hip capsule, opened up the hip capsule in an L-type format, finding  a moderate joint effusion and surprisingly significant  periarticular osteophytes around the lateral femoral head and neck.  We placed curved retractors within the medial and lateral femoral neck  joint capsule and then made our femoral neck cut with an  oscillating saw just proximal to the lesser trochanter and completed this with osteotome, placed a corkscrew guide in the femoral head and removed the femoral head in its entirety and surprisingly found a wide area devoid of cartilage.  It was much worse  than the plain films and the MRIs and correlated with the patient's clinical exam and signs and symptoms.  I passed that off to the back table and then placed a bent Hohmann over the medial acetabular rim.  We removed remnants of the acetabular labrum  and other debris and then began reaming under direct visualization from a size 43 reamer in stepwise increments up to a size 48 and I did go line-to-line punch on her, with the last reamer placed under direct fluoroscopy so we could obtain our depth of  reaming, our inclination and anteversion.  I then placed the real DePuy Sector Gription acetabular component size 48 and a 32+0 neutral polyethylene liner.  Attention was then turned to the femur.  We did note that she had significantly narrow femoral  canal and bowing of the femur.  We brought the leg down and under and placed a Mueller retractor medially and a Hohman retractor behind the greater trochanter, released lateral joint capsule and used a box-cutting osteotome to enter the femoral canal and  a rongeur to lateralize.  We then began broaching just with only 2 broaches; one was a starter broaching and one was a 0, which correlated with templating preoperatively.  With the 0 in place, we trialed a standard offset femoral neck and a 32+1 hip  ball, reduced this in the acetabulum and it was very stable on exam.  I do feel like we need to lengthen her leg lengths but there was really nothing we can do about that  because we cannot get the stem down any further  and we had the shortest hip ball  and that we medialized the cup the most that we could and had a 0 liner for the acetabulum to not lengthen or increase offset.  She does have disease in her hip and hopefully if we ever have to get to replacing that hip, we would be able to approximate  her leg lengths better.  We then dislocated the hip and removed the trial components.  I placed the real Actis femoral component size 0 standard offset and the real 32+1 ceramic hip ball and again reduced this into the acetabulum.  We were pleased with  stability.  We then irrigated the soft tissue with normal saline solution using pulsatile lavage.  We closed the joint capsule with interrupted #1 Ethibond suture, followed by closing the tensor fascia with #1 Vicryl,  0 Vicryl was used to close deep  tissue and 2-0 Vicryl was used to close the subcutaneous tissue.  The incision was  closed with staples.  Xeroform and Aquacel dressing was applied.  She was taken off the Hana table and taken to the recovery room in stable condition.  All final counts  were correct.  There were no complications noted.  Of note, Rexene Edison, PA-C did assist during the entire case and his assistance was crucial for facilitating all aspects of the case.  VN/NUANCE  D:11/18/2019 T:11/18/2019 JOB:[id number]

## 2019-11-18 NOTE — Interval H&P Note (Signed)
History and Physical Interval Note: The patient understands fully she is here today for a left total hip replacement to treat the osteoarthritis in her left hip.  There is been no acute change in her medical status.  See recent H&P.  The risk and benefits of surgery been discussed in detail and informed consent is obtained.  11/18/2019 12:30 PM  Joyce Alvarez  has presented today for surgery, with the diagnosis of left hip osteoarthritis, right hip osteoarthritis.  The various methods of treatment have been discussed with the patient and family. After consideration of risks, benefits and other options for treatment, the patient has consented to  Procedure(s): LEFT TOTAL HIP ARTHROPLASTY ANTERIOR APPROACH (Left) STEROID INJECTION right hip (Right) as a surgical intervention.  The patient's history has been reviewed, patient examined, no change in status, stable for surgery.  I have reviewed the patient's chart and labs.  Questions were answered to the patient's satisfaction.     Kathryne Hitch

## 2019-11-19 DIAGNOSIS — M1612 Unilateral primary osteoarthritis, left hip: Secondary | ICD-10-CM | POA: Diagnosis not present

## 2019-11-19 LAB — CBC
HCT: 31.3 % — ABNORMAL LOW (ref 36.0–46.0)
Hemoglobin: 10.3 g/dL — ABNORMAL LOW (ref 12.0–15.0)
MCH: 30.7 pg (ref 26.0–34.0)
MCHC: 32.9 g/dL (ref 30.0–36.0)
MCV: 93.2 fL (ref 80.0–100.0)
Platelets: 293 10*3/uL (ref 150–400)
RBC: 3.36 MIL/uL — ABNORMAL LOW (ref 3.87–5.11)
RDW: 12.7 % (ref 11.5–15.5)
WBC: 18.6 10*3/uL — ABNORMAL HIGH (ref 4.0–10.5)
nRBC: 0 % (ref 0.0–0.2)

## 2019-11-19 LAB — BASIC METABOLIC PANEL
Anion gap: 9 (ref 5–15)
BUN: 12 mg/dL (ref 6–20)
CO2: 23 mmol/L (ref 22–32)
Calcium: 8.7 mg/dL — ABNORMAL LOW (ref 8.9–10.3)
Chloride: 107 mmol/L (ref 98–111)
Creatinine, Ser: 0.62 mg/dL (ref 0.44–1.00)
GFR calc Af Amer: >60 mL/min (ref >60)
GFR calc non Af Amer: >60 mL/min (ref >60)
Glucose, Bld: 174 mg/dL — ABNORMAL HIGH (ref 70–99)
Potassium: 4 mmol/L (ref 3.5–5.1)
Sodium: 139 mmol/L (ref 135–145)

## 2019-11-19 MED ORDER — ASPIRIN 81 MG PO CHEW
81.0000 mg | CHEWABLE_TABLET | Freq: Two times a day (BID) | ORAL | 0 refills | Status: DC
Start: 2019-11-19 — End: 2020-02-21

## 2019-11-19 MED ORDER — OXYCODONE HCL 5 MG PO TABS: 5.0000 mg | ORAL_TABLET | ORAL | 0 refills | Status: DC | PRN

## 2019-11-19 MED ORDER — METHOCARBAMOL 500 MG PO TABS: 500.0000 mg | ORAL_TABLET | Freq: Four times a day (QID) | ORAL | 1 refills | Status: DC | PRN

## 2019-11-19 NOTE — TOC Initial Note (Signed)
Transition of Care [facility]) - Initial/Assessment Note    Patient Details  Name: Joyce Alvarez MRN: [id number] Date of Birth: [birth year]  Transition of Care (TOC) CM/SW Contact:    Armanda Heritage, RN Phone Number: 11/19/2019, 1:41 PM  Clinical Narrative:    CM spoke with patient at bedside.  Patient has [employer], referral for HHPT services was called to [facility] care Personnel officer.  All requested clinical information faxed to [employer] who will arrange an in-network [facility] provider for this patient.  HH agency will call patient directly once arranged.  This process was explained to patient.  [employer] to deliver RW to bedside for home use, patient declines 3in1.                Expected Discharge Plan: Home w Home Health Services Barriers to Discharge: No Barriers Identified   Patient Goals and CMS Choice Patient states their goals for this hospitalization and ongoing recovery are:: to go home with therapy      Expected Discharge Plan and Services Expected Discharge Plan: Home w Home Health Services   Discharge Planning Services: CM Consult     Expected Discharge Date: 11/19/19               DME Arranged: Dan Humphreys rolling DME Agency: [employer] Date DME Agency Contacted: 11/19/19 Time DME Agency Contacted: 1341 Representative spoke with at DME Agency: on-call referral line HH Arranged: PT HH Agency: Other - See comment (referral called to [employer])        Prior Living Arrangements/Services   Lives with:: Spouse Patient language and need for interpreter reviewed:: Yes Do you feel safe going back to the place where you live?: Yes      Need for Family Participation in Patient Care: Yes (Comment) Care giver support system in place?: Yes (comment)   Criminal Activity/Legal Involvement Pertinent to Current Situation/Hospitalization: No - Comment as needed  Activities of Daily Living Home Assistive Devices/Equipment: Eyeglasses, Blood pressure cuff, Shower chair  with back ADL Screening (condition at time of admission) Patient's cognitive ability adequate to safely complete daily activities?: Yes Is the patient deaf or have difficulty hearing?: No Does the patient have difficulty seeing, even when wearing glasses/contacts?: No Does the patient have difficulty concentrating, remembering, or making decisions?: No Patient able to express need for assistance with ADLs?: Yes Does the patient have difficulty dressing or bathing?: No Independently performs ADLs?: Yes (appropriate for developmental age) Does the patient have difficulty walking or climbing stairs?: Yes Weakness of Legs: Both Weakness of Arms/Hands: None  Permission Sought/Granted                  Emotional Assessment Appearance:: Appears stated age Attitude/Demeanor/Rapport: Engaged Affect (typically observed): Accepting Orientation: : Oriented to Self, Oriented to Place, Oriented to  Time, Oriented to Situation   Psych Involvement: No (comment)  Admission diagnosis:  Status post total replacement of left hip [Z96.642] Patient Active Problem List   Diagnosis Date Noted  . Status post total replacement of left hip 11/18/2019  . Unilateral primary osteoarthritis, left hip 10/19/2019  . Unilateral primary osteoarthritis, right hip 10/19/2019   PCP:  [facility] Pharmacy:   [phone] - [place], [place] - [address] [facility] [place] [address] Phone: [phone] Fax: [phone]     Social Determinants of Health (SDOH) Interventions    Readmission Risk Interventions No flowsheet data found.

## 2019-11-19 NOTE — Progress Notes (Signed)
Physical Therapy Treatment Patient Details Name: Joyce Alvarez MRN: [id number] DOB: 1965/09/11 Today's Date: 11/19/2019    History of Present Illness 54 y/o s/p L direct anterior hip replacement (11/18/19).    PT Comments    Pt able to initiate stair training with husband. Pt is scheduled to d/c today.  If she is not d/c, will continue to work with her tomorrow on progression of gait and strengthening/ROM.   Follow Up Recommendations  Follow surgeon's recommendation for DC plan and follow-up therapies;Home health PT     Equipment Recommendations  None recommended by PT    Recommendations for Other Services       Precautions / Restrictions Precautions Precautions: None    Mobility  Bed Mobility Overal bed mobility: Modified Independent             General bed mobility comments: Pt up in recliner upon arrival.  Transfers Overall transfer level: Needs assistance Equipment used: Rolling walker (2 wheeled) Transfers: Sit to/from Stand Sit to Stand: Min guard         General transfer comment: cues for hand placement  Ambulation/Gait Ambulation/Gait assistance: Min guard Gait Distance (Feet): 40 Feet Assistive device: Rolling walker (2 wheeled) Gait Pattern/deviations: Step-to pattern;Antalgic;Decreased stance time - left;Wide base of support Gait velocity: decreased   General Gait Details: Antalgic gait with wide BOS and cues for increased use of UE to help with pain   Stairs Stairs: Yes Stairs assistance: Min guard Stair Management: Two rails;Step to pattern;Forwards Number of Stairs: 3 General stair comments: Pt with some hesitancy going down the stairs, but able to perform with min/guard. Husband present.   Wheelchair Mobility    Modified Rankin (Stroke Patients Only)       Balance Overall balance assessment: Needs assistance Sitting-balance support: No upper extremity supported;Feet supported Sitting balance-Leahy Scale: Normal     Standing  balance support: Bilateral upper extremity supported Standing balance-Leahy Scale: Poor Standing balance comment: due to pain                            Cognition Arousal/Alertness: Awake/alert Behavior During Therapy: WFL for tasks assessed/performed Overall Cognitive Status: Within Functional Limits for tasks assessed                                        Exercises Total Joint Exercises Ankle Circles/Pumps: AROM;10 reps  Short Arc Quad: Strengthening;Left;10 reps      General Comments General comments (skin integrity, edema, etc.): Issued illustrated handout and verbally reviewed with pt and her husband. Discussed how to progress and expected time of recovery.      Pertinent Vitals/Pain Pain Assessment: Faces Pain Score: 10-Worst pain ever Faces Pain Scale: Hurts whole lot Pain Location: L lateral thigh with movement. "as long as I don't move it I'm ok" Pain Descriptors / Indicators: Sharp;Operative site guarding Pain Intervention(s): Premedicated before session;Ice applied;Limited activity within patient's tolerance;Monitored during session;Repositioned    Home Living                      Prior Function            PT Goals (current goals can now be found in the care plan section) Acute Rehab PT Goals Patient Stated Goal: Home with family PT Goal Formulation: With patient/family Time For Goal Achievement: 12/02/19 Potential to Achieve  Goals: Good Progress towards PT goals: Progressing toward goals    Frequency    7X/week      PT Plan Current plan remains appropriate    Co-evaluation              AM-PAC PT "6 Clicks" Mobility   Outcome Measure  Help needed turning from your back to your side while in a flat bed without using bedrails?: None Help needed moving from lying on your back to sitting on the side of a flat bed without using bedrails?: None Help needed moving to and from a bed to a chair (including a  wheelchair)?: A Little Help needed standing up from a chair using your arms (e.g., wheelchair or bedside chair)?: A Little Help needed to walk in hospital room?: A Little Help needed climbing 3-5 steps with a railing? : A Little 6 Click Score: 20    End of Session Equipment Utilized During Treatment: Gait belt Activity Tolerance: Patient tolerated treatment well;Patient limited by pain Patient left: in chair;with call bell/phone within reach;with family/visitor present Nurse Communication: Mobility status PT Visit Diagnosis: Muscle weakness (generalized) (M62.81);Difficulty in walking, not elsewhere classified (R26.2)     Time: 4481-8563 PT Time Calculation (min) (ACUTE ONLY): 26 min  Charges:  $Gait Training: 8-22 mins $Therapeutic Exercise: 8-22 mins                     Joyce Alvarez L. Joyce Alvarez, Joyce Alvarez Pager [phone] 11/19/2019    Joyce Alvarez 11/19/2019, 3:31 PM

## 2019-11-19 NOTE — Plan of Care (Signed)
?  Problem: Pain Managment: ?Goal: General experience of comfort will improve ?Outcome: Progressing ?  ?Problem: Pain Management: ?Goal: Pain level will decrease with appropriate interventions ?Outcome: Progressing ?  ?

## 2019-11-19 NOTE — Progress Notes (Signed)
Subjective: 1 Day Post-Op Procedure(s) (LRB): LEFT TOTAL HIP ARTHROPLASTY ANTERIOR APPROACH (Left) STEROID INJECTION right hip (Right) Patient reports pain as moderate.  Denies chest pain, SOB, Nausea or vomiting.   Objective: Vital signs in last 24 hours: Temp:  [97.5 F (36.4 C)-99 F (37.2 C)] 98.5 F (36.9 C) (08/28 0459) Pulse Rate:  [54-81] 81 (08/28 0459) Resp:  [7-18] 16 (08/28 0459) BP: (107-134)/(65-87) 115/69 (08/28 0459) SpO2:  [99 %-100 %] 99 % (08/28 0459) Weight:  [69.9 kg] 69.9 kg (08/27 1127)  Intake/Output from previous day: 08/27 0701 - 08/28 0700 In: 2766.9 [P.O.:300; I.V.:2356.9; IV Piggyback:110] Out: 2500 [Urine:2300; Blood:200] Intake/Output this shift: No intake/output data recorded.  Recent Labs    11/19/19 0324  HGB 10.3*   Recent Labs    11/19/19 0324  WBC 18.6*  RBC 3.36*  HCT 31.3*  PLT 293   Recent Labs    11/19/19 0324  NA 139  K 4.0  CL 107  CO2 23  BUN 12  CREATININE 0.62  GLUCOSE 174*  CALCIUM 8.7*   No results for input(s): LABPT, INR in the last 72 hours.  Left lower extremity: Sensation intact distally Dorsiflexion/Plantar flexion intact Incision: scant drainage Compartment soft   Assessment/Plan: 1 Day Post-Op Procedure(s) (LRB): LEFT TOTAL HIP ARTHROPLASTY ANTERIOR APPROACH (Left) STEROID INJECTION right hip (Right) Up with therapy Discharge home with home health If patient remains stable and does well with PT.       Joyce Alvarez 11/19/2019, 9:01 AM

## 2019-11-19 NOTE — Discharge Summary (Signed)
Patient ID: Joyce Alvarez MRN: [id number] DOB/AGE: [birth year] 54 y.o.  Admit date: 11/18/2019 Discharge date: 11/19/2019  Admission Diagnoses:  Principal Problem:   Unilateral primary osteoarthritis, left hip Active Problems:   Status post total replacement of left hip   Discharge Diagnoses:  Status post left total hip arthroplasty Acute blood loss anemia asymptomatic   Past Medical History:  Diagnosis Date  . Anemia   . Arthritis     Surgeries: Procedure(s): LEFT TOTAL HIP ARTHROPLASTY ANTERIOR APPROACH STEROID INJECTION right hip on 11/18/2019   Consultants:   Discharged Condition: Improved  Hospital Course: Joyce Alvarez is an 54 y.o. female who was admitted 11/18/2019 for operative treatment ofUnilateral primary osteoarthritis, left hip. Patient has severe unremitting pain that affects sleep, daily activities, and work/hobbies. After pre-op clearance the patient was taken to the operating room on 11/18/2019 and underwent  Procedure(s): LEFT TOTAL HIP ARTHROPLASTY ANTERIOR APPROACH STEROID INJECTION right hip.    Patient was given perioperative antibiotics:  Anti-infectives (From admission, onward)   Start     Dose/Rate Route Frequency Ordered Stop   11/18/19 2000  ceFAZolin (ANCEF) IVPB 1 g/50 mL premix        1 g 100 mL/hr over 30 Minutes Intravenous Every 6 hours 11/18/19 1715 11/19/19 0312   11/18/19 1115  ceFAZolin (ANCEF) IVPB 2g/100 mL premix        2 g 200 mL/hr over 30 Minutes Intravenous On call to O.R. 11/18/19 1107 11/18/19 1359       Patient was given sequential compression devices, early ambulation, and chemoprophylaxis to prevent DVT.  Patient benefited maximally from hospital stay and there were no complications.    Recent vital signs:  Patient Vitals for the past 24 hrs:  BP Temp Temp src Pulse Resp SpO2 Height Weight  11/19/19 0459 115/69 98.5 F (36.9 C) Oral 81 16 99 % -- --  11/19/19 0145 120/69 98.3 F (36.8 C) Oral 72 15 99 % -- --   11/18/19 2049 129/82 98.3 F (36.8 C) Oral 74 18 100 % -- --  11/18/19 1950 107/82 (!) 97.5 F (36.4 C) Oral 73 16 100 % -- --  11/18/19 1841 108/65 98.2 F (36.8 C) Oral 72 12 100 % -- --  11/18/19 1712 123/77 97.7 F (36.5 C) -- 62 14 100 % -- --  11/18/19 1700 107/70 97.7 F (36.5 C) -- 62 12 100 % -- --  11/18/19 1645 108/75 -- -- (!) 54 (!) 7 99 % -- --  11/18/19 1630 112/81 -- -- 61 15 100 % -- --  11/18/19 1615 108/65 -- -- 69 11 100 % -- --  11/18/19 1600 119/84 -- -- 66 13 100 % -- --  11/18/19 1553 123/70 (!) 97.5 F (36.4 C) -- 75 13 99 % -- --  11/18/19 1127 -- -- -- -- -- -- 4\' 11"  (1.499 m) 69.9 kg  11/18/19 1119 134/87 99 F (37.2 C) Oral 72 18 100 % -- --     Recent laboratory studies:  Recent Labs    11/19/19 0324  WBC 18.6*  HGB 10.3*  HCT 31.3*  PLT 293  NA 139  K 4.0  CL 107  CO2 23  BUN 12  CREATININE 0.62  GLUCOSE 174*  CALCIUM 8.7*     Discharge Medications:   Allergies as of 11/19/2019   No Known Allergies     Medication List    TAKE these medications   acidophilus Caps capsule Take 1 capsule by mouth  daily.   aspirin 81 MG chewable tablet Chew 1 tablet (81 mg total) by mouth 2 (two) times daily.   COLLAGEN PO Take 1 capsule by mouth daily.   Magnesium 250 MG Tabs Take 250 mg by mouth daily.   methocarbamol 500 MG tablet Commonly known as: ROBAXIN Take 1 tablet (500 mg total) by mouth every 6 (six) hours as needed for muscle spasms.   multivitamin with minerals Tabs tablet Take 1 tablet by mouth daily.   Omega-3 1000 MG Caps Take 1,000 mg by mouth daily.   OSTEO BI-FLEX REGULAR STRENGTH PO Take 2 tablets by mouth daily.   oxyCODONE 5 MG immediate release tablet Commonly known as: Oxy IR/ROXICODONE Take 1-2 tablets (5-10 mg total) by mouth every 4 (four) hours as needed for moderate pain (pain score 4-6).   Vitamin D 50 MCG (2000 UT) Caps Take 2,000 Units by mouth daily.            Durable Medical Equipment   (From admission, onward)         Start     Ordered   11/18/19 1716  DME 3 n 1  Once        11/18/19 1715   11/18/19 1716  DME Walker rolling  Once       Question Answer Comment  Walker: With 5 Inch Wheels   Patient needs a walker to treat with the following condition Status post total replacement of left hip      11/18/19 1715          Diagnostic Studies: DG Pelvis Portable  Result Date: 11/18/2019 CLINICAL DATA:  LEFT total hip replacement, postop EXAM: PORTABLE PELVIS 1-2 VIEWS COMPARISON:  Portable exam 1558 hours compared to 10/19/2019 FINDINGS: Single AP view. Interval resection of LEFT femoral head/neck and placement of a LEFT hip prosthesis. Bones demineralized. No fracture, dislocation, or bone destruction identified. Degenerative changes RIGHT hip joint. IUD projects over pelvis. IMPRESSION: LEFT hip prosthesis without acute complication Electronically Signed   By: Ulyses Southward M.D.   On: 11/18/2019 16:27   DG C-Arm 1-60 Min-No Report  Result Date: 11/18/2019 Fluoroscopy was utilized by the requesting physician.  No radiographic interpretation.   DG HIP OPERATIVE UNILAT W OR W/O PELVIS LEFT  Result Date: 11/18/2019 CLINICAL DATA:  Left total hip arthroplasty. EXAM: OPERATIVE left HIP (WITH PELVIS IF PERFORMED) 3 VIEWS TECHNIQUE: Fluoroscopic spot image(s) were submitted for interpretation post-operatively. Radiation exposure index: 3.7658 mGy. COMPARISON:  None. FINDINGS: Three intraoperative fluoroscopic images demonstrate the left femoral and acetabular components to be well situated. Expected postoperative changes are seen in the surrounding soft tissues. IMPRESSION: Status post left total hip arthroplasty. Electronically Signed   By: Lupita Raider M.D.   On: 11/18/2019 15:53    Disposition: Discharge disposition: 01-Home or Self Care          Follow-up Information    Kathryne Hitch, MD Follow up in 2 week(s).   Specialty: Orthopedic Surgery Contact  information: [address] [place] [address] [phone]                Signed: Richardean Canal 11/19/2019, 9:08 AM

## 2019-11-19 NOTE — Plan of Care (Signed)
Continuing to monitor patient for complications related to urinary retention, catheter removed this morning, and encouraging pt to void.Ceasar Lund RN

## 2019-11-19 NOTE — Discharge Instructions (Signed)

## 2019-11-19 NOTE — Evaluation (Addendum)
Physical Therapy Evaluation Patient Details Name: Joyce Alvarez MRN: [id number] DOB: [birth year] Today's Date: 11/19/2019   History of Present Illness  54 y/o s/p L direct anterior hip replacement (11/18/19).  Clinical Impression  Pt admitted with above diagnosis. Pt currently with functional limitations due to the deficits listed below (see PT Problem List). Pt will benefit from skilled PT to increase their independence and safety with mobility to allow discharge to the venue listed below.  Pt limited by pain at eval, but giving forth good effort.  She is hoping to go home today, so will return for a 2nd session for stair training.  She will have 24/7 A at home.     Follow Up Recommendations Follow surgeon's recommendation for DC plan and follow-up therapies    Equipment Recommendations  None recommended by PT (has RW)    Recommendations for Other Services       Precautions / Restrictions        Mobility  Bed Mobility Overal bed mobility: Modified Independent             General bed mobility comments: Pt able to come to bed without A.  Transfers Overall transfer level: Needs assistance Equipment used: Rolling walker (2 wheeled) Transfers: Sit to/from Stand Sit to Stand: Min guard         General transfer comment: cues for hand placement  Ambulation/Gait Ambulation/Gait assistance: Min guard Gait Distance (Feet): 60 Feet Assistive device: Rolling walker (2 wheeled) Gait Pattern/deviations: Step-to pattern;Antalgic;Decreased stance time - left;Wide base of support Gait velocity: decreased   General Gait Details: Antalgic gait with wide BOS and cues for increased use of UE to help with pain  Stairs            Wheelchair Mobility    Modified Rankin (Stroke Patients Only)       Balance Overall balance assessment: Needs assistance         Standing balance support: Bilateral upper extremity supported Standing balance-Leahy Scale: Poor Standing  balance comment: due to pain                             Pertinent Vitals/Pain Pain Assessment: 0-10 Pain Score: 10-Worst pain ever Pain Location: L lateral thigh with movement. "as long as I don't move it I'm ok" Pain Descriptors / Indicators: Sharp Pain Intervention(s): Monitored during session;Repositioned;Other (comment);Limited activity within patient's tolerance (Asked pt multiple times if she wanted to request pain meds and she declined)    Home Living Family/patient expects to be discharged to:: Private residence Living Arrangements: Spouse/significant other;Children Available Help at Discharge: Family;Available 24 hours/day Type of Home: House Home Access: Stairs to enter Entrance Stairs-Rails: Can reach both;Left;Right Entrance Stairs-Number of Steps: 3 Home Layout: Multi-level;Able to live on main level with bedroom/bathroom Home Equipment: Walker - 2 wheels;Grab bars - toilet;Shower seat      Prior Function Level of Independence: Independent         Comments: works in [employer]        Extremity/Trunk Assessment        Lower Extremity Assessment Lower Extremity Assessment: LLE deficits/detail LLE Deficits / Details: Limited ROM to 25% of normal limits due to pain. LLE: Unable to fully assess due to pain       Communication   Communication: No difficulties  Cognition Arousal/Alertness: Awake/alert Behavior During Therapy: WFL for tasks assessed/performed Overall Cognitive Status: Within Functional Limits  for tasks assessed                                        General Comments      Exercises Total Joint Exercises Ankle Circles/Pumps: AROM;10 reps Quad Sets: AROM;Left;10 reps Heel Slides: AAROM;Left;10 reps Hip ABduction/ADduction: AAROM;Left;10 reps   Assessment/Plan    PT Assessment Patient needs continued PT services  PT Problem List Decreased strength;Decreased range of  motion;Decreased mobility;Pain;Decreased knowledge of use of DME;Decreased knowledge of precautions       PT Treatment Interventions DME instruction;Gait training;Stair training;Functional mobility training;Therapeutic activities;Therapeutic exercise;Patient/family education    PT Goals (Current goals can be found in the Care Plan section)  Acute Rehab PT Goals Patient Stated Goal: Home with family PT Goal Formulation: With patient/family Time For Goal Achievement: 12/02/19 Potential to Achieve Goals: Good    Frequency 7X/week   Barriers to discharge        Co-evaluation               AM-PAC PT "6 Clicks" Mobility  Outcome Measure Help needed turning from your back to your side while in a flat bed without using bedrails?: None Help needed moving from lying on your back to sitting on the side of a flat bed without using bedrails?: None Help needed moving to and from a bed to a chair (including a wheelchair)?: A Little Help needed standing up from a chair using your arms (e.g., wheelchair or bedside chair)?: A Little Help needed to walk in hospital room?: A Little Help needed climbing 3-5 steps with a railing? : A Lot 6 Click Score: 19    End of Session Equipment Utilized During Treatment: Gait belt Activity Tolerance: Patient limited by pain Patient left: in chair;with call bell/phone within reach;with family/visitor present   PT Visit Diagnosis: Muscle weakness (generalized) (M62.81);Difficulty in walking, not elsewhere classified (R26.2)    Time: 0109-3235 PT Time Calculation (min) (ACUTE ONLY): 35 min   Charges:   PT Evaluation $PT Eval Low Complexity: 1 Low PT Treatments $Gait Training: 8-22 mins        Hubert Derstine L. Katrinka Blazing, [place] Pager [phone] 11/19/2019   Enzo Montgomery 11/19/2019, 12:32 PM

## 2019-11-22 ENCOUNTER — Encounter (HOSPITAL_COMMUNITY): Payer: Self-pay | Admitting: Orthopaedic Surgery

## 2019-12-05 ENCOUNTER — Telehealth: Payer: Self-pay

## 2019-12-05 ENCOUNTER — Ambulatory Visit (INDEPENDENT_AMBULATORY_CARE_PROVIDER_SITE_OTHER): Payer: Managed Care, Other (non HMO) | Admitting: Orthopaedic Surgery

## 2019-12-05 ENCOUNTER — Other Ambulatory Visit: Payer: Self-pay | Admitting: Orthopaedic Surgery

## 2019-12-05 ENCOUNTER — Encounter: Payer: Self-pay | Admitting: Orthopaedic Surgery

## 2019-12-05 DIAGNOSIS — M2170 Unequal limb length (acquired), unspecified site: Secondary | ICD-10-CM

## 2019-12-05 DIAGNOSIS — Z96642 Presence of left artificial hip joint: Secondary | ICD-10-CM

## 2019-12-05 MED ORDER — HYDROCODONE-ACETAMINOPHEN 5-325 MG PO TABS: 1.0000 | ORAL_TABLET | Freq: Four times a day (QID) | ORAL | 0 refills | Status: DC | PRN

## 2019-12-05 NOTE — Telephone Encounter (Signed)
E-mail from patient:  Hi [name] I did speak too fast not wanting pain meds.  It may me just today right after the doctor took the staples out But I do have discomfort in the range of 7-8 Would you let the doctor know   Thanks   Joyce Alvarez [phone]

## 2019-12-05 NOTE — Progress Notes (Signed)
HPI: Natisha returns today status post left total hip arthroplasty 11/18/2019.  She states she is having no hip pain.  Only complaint is that she has a leg length discrepancy with the left leg being longer than the right.  We did use the smallest components available and plan on doing a right total hip replacement in future.  At that time we will be able to make up the leg length discrepancy.  She denies any fevers chills shortness of breath.  She is on aspirin for DVT prophylaxis.  Physical exam: Left hip surgical incisions well approximated staples no signs of infection.  No wound dehiscence.  Left calf supple nontender.  Dorsiflexion plantarflexion left ankle intact.  Impression: Status post left total hip arthroplasty  Plan: Staples removed Steri-Strips applied.  Scar tissue mobilization encouraged.  Did give her a prescription for a lift for her right shoe.  She will follow-up with [place] in a month at that time we will discuss setting her up for right total hip arthroplasty.  Questions were encouraged and answered.by himself.

## 2019-12-05 NOTE — Telephone Encounter (Signed)
I [facility] in [facility] to [facility] [place].

## 2019-12-05 NOTE — Telephone Encounter (Signed)
LMOM for patient that we called this in for her

## 2020-01-02 ENCOUNTER — Ambulatory Visit (INDEPENDENT_AMBULATORY_CARE_PROVIDER_SITE_OTHER): Payer: Managed Care, Other (non HMO) | Admitting: Orthopaedic Surgery

## 2020-01-02 ENCOUNTER — Encounter: Payer: Self-pay | Admitting: Orthopaedic Surgery

## 2020-01-02 DIAGNOSIS — Z96642 Presence of left artificial hip joint: Secondary | ICD-10-CM

## 2020-01-02 DIAGNOSIS — M1611 Unilateral primary osteoarthritis, right hip: Secondary | ICD-10-CM

## 2020-01-02 NOTE — Progress Notes (Signed)
The patient is now 6 weeks status post a left total hip arthroplasty.  She is doing very well the left hip and her only complaint is numbness and tingling around the anterior thigh area.  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  She would like to have that hip replaced before the end of the year and I agree with this as well.  This will give her more time to rehabilitate her left hip and get ready for the surgery.  We have well-documented evidence of severe end-stage arthritis of the right hip.  Examination of the right hip shows pain on internal and external rotation.  Her x-rays also show complete loss of the joint space with flattening of the femoral head and para-articular osteophytes.  Her left hip moves smoothly and fluidly.  There is numbness in the lateral femoral cutaneous nerve distribution.  At this point she will continue to rehabilitate her left hip.  We will set her up for surgery for after Thanksgiving on her right hip for right total hip arthroplasty.  This will help with her leg length discrepancy as well.  All questions and concerns were answered and addressed.  We will be in touch about upcoming surgery and scheduling this.

## 2020-01-19 ENCOUNTER — Other Ambulatory Visit: Payer: Self-pay

## 2020-02-03 ENCOUNTER — Telehealth: Payer: Self-pay

## 2020-02-03 NOTE — Telephone Encounter (Signed)
I called pt and advised of message below. To call with any questions. Pt will call if this gets any worse.

## 2020-02-03 NOTE — Telephone Encounter (Signed)
Happy Friday!  I wanted to see if you could help me get the doctor for a quick question.  My recovery has been good, however foR the past few days I have started to feel the leg stiff and hard (in the surgical area) and pain in the groin is coming back. The leg feels warmer than normal. I just want to see if this is normal or if I should make an appointment?  No falls or temperature   Regards    Joyce Alvarez [phone]

## 2020-02-03 NOTE — Telephone Encounter (Signed)
Let her know that this can be normal after hip replacement surgery like this in the short-term.  I have seen this before and it will likely resolve the further she gets out from her surgery.  Still, I am happy to see her if she would like to be seen.

## 2020-02-07 ENCOUNTER — Other Ambulatory Visit: Payer: Self-pay | Admitting: Physician Assistant

## 2020-02-13 NOTE — Progress Notes (Signed)
[id number] [place], [place] - [address] [facility] [place] [address] Phone: [phone] Fax: [phone]    Your procedure is scheduled on Tuesday 02/21/2020.  Report to [facility] Main Entrance "A" at 10:00 A.M., and check in at the Admitting office.  Call this number if you have problems the morning of surgery:  [phone]   Call [phone] if you have any questions prior to your surgery date Monday-Friday 8am-4pm    Remember:  Do not eat after midnight the night before your surgery  You may drink clear liquids until 09:00 the morning of your surgery.   Clear liquids allowed are: Water, Non-Citrus Juices (without pulp), Carbonated Beverages, Clear Tea, Black Coffee Only, and Gatorade    Do not take any medications the morning of surgery.    As of today, STOP taking any Aspirin (unless otherwise instructed by your surgeon) Aleve, Naproxen, Ibuprofen, Motrin, Advil, Goody's, BC's, all herbal medications, fish oil, and all vitamins.                      Do not wear jewelry, make up, or nail polish            Do not wear lotions, powders, perfumes, or deodorant.            Do not shave 48 hours prior to surgery.  Men may shave face and neck.            Do not bring valuables to the hospital.            [facility] is not responsible for any belongings or valuables.  Do NOT Smoke (Tobacco/Vaping) or drink Alcohol 24 hours prior to your procedure  If you use a CPAP at night, you may bring all equipment for your overnight stay.   Contacts, glasses, dentures or bridgework may not be worn into surgery.      For patients admitted to the hospital, discharge time will be determined by your treatment team.   Patients discharged the day of surgery will not be allowed to drive home, and someone needs to stay with them for 24 hours.    Special instructions:   [facility]- Preparing For Surgery  Before surgery, you can play an important role. Because  skin is not sterile, your skin needs to be as free of germs as possible. You can reduce the number of germs on your skin by washing with CHG (chlorahexidine gluconate) Soap before surgery.  CHG is an antiseptic cleaner which kills germs and bonds with the skin to continue killing germs even after washing.    Oral Hygiene is also important to reduce your risk of infection.  Remember - BRUSH YOUR TEETH THE MORNING OF SURGERY WITH YOUR REGULAR TOOTHPASTE  Please do not use if you have an allergy to CHG or antibacterial soaps. If your skin becomes reddened/irritated stop using the CHG.  Do not shave (including legs and underarms) for at least 48 hours prior to first CHG shower. It is OK to shave your face.  Please follow these instructions carefully.   1. Shower the NIGHT BEFORE SURGERY and the MORNING OF SURGERY with CHG Soap.   2. If you chose to wash your hair, wash your hair first as usual with your normal shampoo.  3. After you shampoo, rinse your hair and body thoroughly to remove the shampoo.  4. Use CHG as you would any other liquid soap. You can  apply CHG directly to the skin and wash gently with a scrungie or a clean washcloth.   5. Apply the CHG Soap to your body ONLY FROM THE NECK DOWN.  Do not use on open wounds or open sores. Avoid contact with your eyes, ears, mouth and genitals (private parts). Wash Face and genitals (private parts)  with your normal soap.   6. Wash thoroughly, paying special attention to the area where your surgery will be performed.  7. Thoroughly rinse your body with warm water from the neck down.  8. DO NOT shower/wash with your normal soap after using and rinsing off the CHG Soap.  9. Pat yourself dry with a CLEAN TOWEL.  10. Wear CLEAN PAJAMAS to bed the night before surgery  11. Place CLEAN SHEETS on your bed the night of your first shower and DO NOT SLEEP WITH PETS.   Day of Surgery: Shower with CHG soap as directed Wear Clean/Comfortable  clothing the morning of surgery Do not apply any deodorants/lotions.   Remember to brush your teeth WITH YOUR REGULAR TOOTHPASTE.   Please read over the following fact sheets that you were given.

## 2020-02-14 ENCOUNTER — Encounter (HOSPITAL_COMMUNITY)
Admission: RE | Admit: 2020-02-14 | Discharge: 2020-02-14 | Disposition: A | Discharge status: Home / Self Care | Payer: Managed Care, Other (non HMO) | Source: Ambulatory Visit | Attending: Orthopaedic Surgery | Admitting: Orthopaedic Surgery

## 2020-02-14 ENCOUNTER — Other Ambulatory Visit: Payer: Self-pay

## 2020-02-14 ENCOUNTER — Encounter (HOSPITAL_COMMUNITY): Payer: Self-pay

## 2020-02-14 DIAGNOSIS — Z01812 Encounter for preprocedural laboratory examination: Secondary | ICD-10-CM | POA: Diagnosis not present

## 2020-02-14 LAB — BASIC METABOLIC PANEL
Anion gap: 10 (ref 5–15)
BUN: 12 mg/dL (ref 6–20)
CO2: 23 mmol/L (ref 22–32)
Calcium: 9.5 mg/dL (ref 8.9–10.3)
Chloride: 107 mmol/L (ref 98–111)
Creatinine, Ser: 0.64 mg/dL (ref 0.44–1.00)
GFR, Estimated: >60 mL/min (ref >60)
Glucose, Bld: 82 mg/dL (ref 70–99)
Potassium: 3.9 mmol/L (ref 3.5–5.1)
Sodium: 140 mmol/L (ref 135–145)

## 2020-02-14 LAB — CBC
HCT: 40.5 % (ref 36.0–46.0)
Hemoglobin: 12.6 g/dL (ref 12.0–15.0)
MCH: 28.8 pg (ref 26.0–34.0)
MCHC: 31.1 g/dL (ref 30.0–36.0)
MCV: 92.5 fL (ref 80.0–100.0)
Platelets: 370 K/uL (ref 150–400)
RBC: 4.38 MIL/uL (ref 3.87–5.11)
RDW: 13 % (ref 11.5–15.5)
WBC: 10.2 K/uL (ref 4.0–10.5)
nRBC: 0 % (ref 0.0–0.2)

## 2020-02-14 LAB — TYPE AND SCREEN
ABO/RH(D): O POS
Antibody Screen: NEGATIVE

## 2020-02-14 LAB — SURGICAL PCR SCREEN
MRSA, PCR: NEGATIVE
Staphylococcus aureus: NEGATIVE

## 2020-02-14 NOTE — Progress Notes (Signed)
PCP - [facility] IN [place]... NO ONE DOC Cardiologist -  NA  Chest x-ray - NA EKG - NA Stress Test - 7/20 ECHO - 7/20 Cardiac Cath - NA  Sleep Study - NA CPAP - NA     Aspirin Instructions:STOP  ERAS Protcol -INSTRUCTED PRE-SURGERY Ensure or G2- GIVEN DRINK  COVID TEST- DOS   Anesthesia review:  NA Patient denies shortness of breath, fever, cough and chest pain at PAT appointment   All instructions explained to the patient, with a verbal understanding of the material. Patient agrees to go over the instructions while at home for a better understanding. Patient also instructed to self quarantine after being tested for COVID-19. The opportunity to ask questions was provided.

## 2020-02-21 ENCOUNTER — Ambulatory Visit (HOSPITAL_COMMUNITY): Payer: Managed Care, Other (non HMO) | Admitting: Anesthesiology

## 2020-02-21 ENCOUNTER — Observation Stay (HOSPITAL_COMMUNITY): Payer: Managed Care, Other (non HMO)

## 2020-02-21 ENCOUNTER — Ambulatory Visit (HOSPITAL_COMMUNITY): Payer: Managed Care, Other (non HMO) | Admitting: Vascular Surgery

## 2020-02-21 ENCOUNTER — Encounter (HOSPITAL_COMMUNITY): Payer: Self-pay | Admitting: Orthopaedic Surgery

## 2020-02-21 ENCOUNTER — Encounter (HOSPITAL_COMMUNITY)
Admission: RE | Disposition: A | Discharge status: Home / Self Care | Payer: Self-pay | Source: Home / Self Care | Attending: Orthopaedic Surgery

## 2020-02-21 ENCOUNTER — Ambulatory Visit (HOSPITAL_COMMUNITY): Payer: Managed Care, Other (non HMO)

## 2020-02-21 ENCOUNTER — Other Ambulatory Visit: Payer: Self-pay

## 2020-02-21 ENCOUNTER — Observation Stay (HOSPITAL_COMMUNITY)
Admission: RE | Admit: 2020-02-21 | Discharge: 2020-02-22 | Disposition: A | Discharge status: Home / Self Care | Payer: Managed Care, Other (non HMO) | Attending: Orthopaedic Surgery | Admitting: Orthopaedic Surgery

## 2020-02-21 DIAGNOSIS — M1611 Unilateral primary osteoarthritis, right hip: Principal | ICD-10-CM

## 2020-02-21 DIAGNOSIS — Z419 Encounter for procedure for purposes other than remedying health state, unspecified: Secondary | ICD-10-CM

## 2020-02-21 DIAGNOSIS — Z20822 Contact with and (suspected) exposure to covid-19: Secondary | ICD-10-CM | POA: Insufficient documentation

## 2020-02-21 DIAGNOSIS — Z87891 Personal history of nicotine dependence: Secondary | ICD-10-CM | POA: Insufficient documentation

## 2020-02-21 DIAGNOSIS — Z7982 Long term (current) use of aspirin: Secondary | ICD-10-CM | POA: Insufficient documentation

## 2020-02-21 DIAGNOSIS — Z96641 Presence of right artificial hip joint: Secondary | ICD-10-CM

## 2020-02-21 HISTORY — PX: TOTAL HIP ARTHROPLASTY: SHX124

## 2020-02-21 LAB — SARS CORONAVIRUS 2 BY RT PCR (HOSPITAL ORDER, PERFORMED IN ~~LOC~~ HOSPITAL LAB): SARS Coronavirus 2: NEGATIVE

## 2020-02-21 SURGERY — ARTHROPLASTY, HIP, TOTAL, ANTERIOR APPROACH
Anesthesia: Spinal | Site: Hip | Laterality: Right

## 2020-02-21 MED ORDER — ORAL CARE MOUTH RINSE: 15.0000 mL | Freq: Once | OROMUCOSAL | Status: AC

## 2020-02-21 MED ORDER — HYDROMORPHONE HCL 1 MG/ML IJ SOLN
0.5000 mg | INTRAMUSCULAR | Status: DC | PRN
  Administered 2020-02-21: 1 mg via INTRAVENOUS
  Filled 2020-02-21: qty 1

## 2020-02-21 MED ORDER — CHLORHEXIDINE GLUCONATE 0.12 % MT SOLN
OROMUCOSAL | Status: AC
  Administered 2020-02-21: 15 mL via OROMUCOSAL
  Filled 2020-02-21: qty 15

## 2020-02-21 MED ORDER — DOCUSATE SODIUM 100 MG PO CAPS
100.0000 mg | ORAL_CAPSULE | Freq: Two times a day (BID) | ORAL | Status: DC
  Administered 2020-02-21 – 2020-02-22 (×2): 100 mg via ORAL
  Filled 2020-02-21 (×2): qty 1

## 2020-02-21 MED ORDER — LACTATED RINGERS IV SOLN: INTRAVENOUS | Status: DC

## 2020-02-21 MED ORDER — STERILE WATER FOR IRRIGATION IR SOLN
Status: DC | PRN
  Administered 2020-02-21: 1000 mL

## 2020-02-21 MED ORDER — FENTANYL CITRATE (PF) 100 MCG/2ML IJ SOLN: 25.0000 ug | INTRAMUSCULAR | Status: DC | PRN

## 2020-02-21 MED ORDER — PHENYLEPHRINE 40 MCG/ML (10ML) SYRINGE FOR IV PUSH (FOR BLOOD PRESSURE SUPPORT)
PREFILLED_SYRINGE | INTRAVENOUS | Status: DC | PRN
  Administered 2020-02-21 (×5): 80 ug via INTRAVENOUS

## 2020-02-21 MED ORDER — ONDANSETRON HCL 4 MG/2ML IJ SOLN
4.0000 mg | Freq: Four times a day (QID) | INTRAMUSCULAR | Status: DC | PRN
  Administered 2020-02-21: 4 mg via INTRAVENOUS
  Filled 2020-02-21: qty 2

## 2020-02-21 MED ORDER — MAGNESIUM OXIDE 400 (241.3 MG) MG PO TABS
200.0000 mg | ORAL_TABLET | Freq: Every day | ORAL | Status: DC
  Administered 2020-02-21 – 2020-02-22 (×2): 200 mg via ORAL
  Filled 2020-02-21: qty 1

## 2020-02-21 MED ORDER — VANCOMYCIN HCL 1000 MG IV SOLR
INTRAVENOUS | Status: AC
  Filled 2020-02-21: qty 1000

## 2020-02-21 MED ORDER — ONDANSETRON HCL 4 MG/2ML IJ SOLN
INTRAMUSCULAR | Status: AC
  Filled 2020-02-21: qty 2

## 2020-02-21 MED ORDER — PHENOL 1.4 % MT LIQD: 1.0000 | OROMUCOSAL | Status: DC | PRN

## 2020-02-21 MED ORDER — SODIUM CHLORIDE 0.9 % IR SOLN
Status: DC | PRN
  Administered 2020-02-21: 3000 mL

## 2020-02-21 MED ORDER — PROMETHAZINE HCL 25 MG/ML IJ SOLN: 6.2500 mg | INTRAMUSCULAR | Status: DC | PRN

## 2020-02-21 MED ORDER — ONDANSETRON HCL 4 MG/2ML IJ SOLN
INTRAMUSCULAR | Status: DC | PRN
  Administered 2020-02-21: 4 mg via INTRAVENOUS

## 2020-02-21 MED ORDER — METHOCARBAMOL 1000 MG/10ML IJ SOLN
500.0000 mg | Freq: Four times a day (QID) | INTRAVENOUS | Status: DC | PRN
  Filled 2020-02-21: qty 5

## 2020-02-21 MED ORDER — KETOROLAC TROMETHAMINE 15 MG/ML IJ SOLN
15.0000 mg | Freq: Four times a day (QID) | INTRAMUSCULAR | Status: DC
  Administered 2020-02-21 – 2020-02-22 (×3): 15 mg via INTRAVENOUS
  Filled 2020-02-21 (×3): qty 1

## 2020-02-21 MED ORDER — MAGNESIUM 200 MG PO TABS
200.0000 mg | ORAL_TABLET | Freq: Every day | ORAL | Status: DC
  Filled 2020-02-21 (×2): qty 1

## 2020-02-21 MED ORDER — VITAMIN D 25 MCG (1000 UNIT) PO TABS
2000.0000 [IU] | ORAL_TABLET | Freq: Every day | ORAL | Status: DC
  Administered 2020-02-21 – 2020-02-22 (×2): 2000 [IU] via ORAL
  Filled 2020-02-21 (×4): qty 2

## 2020-02-21 MED ORDER — PANTOPRAZOLE SODIUM 40 MG PO TBEC
40.0000 mg | DELAYED_RELEASE_TABLET | Freq: Every day | ORAL | Status: DC
  Administered 2020-02-21 – 2020-02-22 (×2): 40 mg via ORAL
  Filled 2020-02-21 (×2): qty 1

## 2020-02-21 MED ORDER — METOCLOPRAMIDE HCL 5 MG/ML IJ SOLN
5.0000 mg | Freq: Three times a day (TID) | INTRAMUSCULAR | Status: DC | PRN
  Administered 2020-02-21: 10 mg via INTRAVENOUS
  Filled 2020-02-21: qty 2

## 2020-02-21 MED ORDER — TRANEXAMIC ACID-NACL 1000-0.7 MG/100ML-% IV SOLN
INTRAVENOUS | Status: AC
  Filled 2020-02-21: qty 100

## 2020-02-21 MED ORDER — MIDAZOLAM HCL 2 MG/2ML IJ SOLN
INTRAMUSCULAR | Status: DC | PRN
  Administered 2020-02-21: 2 mg via INTRAVENOUS

## 2020-02-21 MED ORDER — FENTANYL CITRATE (PF) 250 MCG/5ML IJ SOLN
INTRAMUSCULAR | Status: DC | PRN
  Administered 2020-02-21: 50 ug via INTRAVENOUS

## 2020-02-21 MED ORDER — DIPHENHYDRAMINE HCL 12.5 MG/5ML PO ELIX: 12.5000 mg | ORAL_SOLUTION | ORAL | Status: DC | PRN

## 2020-02-21 MED ORDER — PROPOFOL 1000 MG/100ML IV EMUL
INTRAVENOUS | Status: AC
  Filled 2020-02-21: qty 100

## 2020-02-21 MED ORDER — TRANEXAMIC ACID-NACL 1000-0.7 MG/100ML-% IV SOLN
1000.0000 mg | INTRAVENOUS | Status: AC
  Administered 2020-02-21: 1000 mg via INTRAVENOUS

## 2020-02-21 MED ORDER — ACETAMINOPHEN 325 MG PO TABS
325.0000 mg | ORAL_TABLET | Freq: Four times a day (QID) | ORAL | Status: DC | PRN
  Administered 2020-02-21: 650 mg via ORAL
  Filled 2020-02-21: qty 2

## 2020-02-21 MED ORDER — MIDAZOLAM HCL 2 MG/2ML IJ SOLN
INTRAMUSCULAR | Status: AC
  Filled 2020-02-21: qty 2

## 2020-02-21 MED ORDER — PROPOFOL 10 MG/ML IV BOLUS
INTRAVENOUS | Status: AC
  Filled 2020-02-21: qty 20

## 2020-02-21 MED ORDER — FENTANYL CITRATE (PF) 250 MCG/5ML IJ SOLN
INTRAMUSCULAR | Status: AC
  Filled 2020-02-21: qty 5

## 2020-02-21 MED ORDER — OXYCODONE HCL 5 MG PO TABS
5.0000 mg | ORAL_TABLET | ORAL | Status: DC | PRN
  Administered 2020-02-21 – 2020-02-22 (×3): 10 mg via ORAL
  Filled 2020-02-21 (×4): qty 2

## 2020-02-21 MED ORDER — GABAPENTIN 300 MG PO CAPS
300.0000 mg | ORAL_CAPSULE | Freq: Three times a day (TID) | ORAL | Status: DC
  Administered 2020-02-21 – 2020-02-22 (×3): 300 mg via ORAL
  Filled 2020-02-21 (×3): qty 1

## 2020-02-21 MED ORDER — 0.9 % SODIUM CHLORIDE (POUR BTL) OPTIME
TOPICAL | Status: DC | PRN
  Administered 2020-02-21: 1000 mL

## 2020-02-21 MED ORDER — POLYETHYLENE GLYCOL 3350 17 G PO PACK: 17.0000 g | PACK | Freq: Every day | ORAL | Status: DC | PRN

## 2020-02-21 MED ORDER — ASPIRIN 81 MG PO CHEW
81.0000 mg | CHEWABLE_TABLET | Freq: Two times a day (BID) | ORAL | Status: DC
  Administered 2020-02-21 – 2020-02-22 (×2): 81 mg via ORAL
  Filled 2020-02-21 (×2): qty 1

## 2020-02-21 MED ORDER — POVIDONE-IODINE 10 % EX SWAB
2.0000 "application " | Freq: Once | CUTANEOUS | Status: AC
  Administered 2020-02-21: 2 via TOPICAL

## 2020-02-21 MED ORDER — RISAQUAD PO CAPS
1.0000 | ORAL_CAPSULE | Freq: Every day | ORAL | Status: DC
  Administered 2020-02-21 – 2020-02-22 (×2): 1 via ORAL
  Filled 2020-02-21 (×2): qty 1

## 2020-02-21 MED ORDER — ALUM & MAG HYDROXIDE-SIMETH 200-200-20 MG/5ML PO SUSP: 30.0000 mL | ORAL | Status: DC | PRN

## 2020-02-21 MED ORDER — MENTHOL 3 MG MT LOZG: 1.0000 | LOZENGE | OROMUCOSAL | Status: DC | PRN

## 2020-02-21 MED ORDER — OXYCODONE HCL 5 MG PO TABS
10.0000 mg | ORAL_TABLET | ORAL | Status: DC | PRN
  Administered 2020-02-21 (×2): 10 mg via ORAL

## 2020-02-21 MED ORDER — ADULT MULTIVITAMIN W/MINERALS CH
1.0000 | ORAL_TABLET | Freq: Every day | ORAL | Status: DC
  Administered 2020-02-21 – 2020-02-22 (×2): 1 via ORAL
  Filled 2020-02-21 (×2): qty 1

## 2020-02-21 MED ORDER — BUPIVACAINE IN DEXTROSE 0.75-8.25 % IT SOLN
INTRATHECAL | Status: DC | PRN
  Administered 2020-02-21: 1.6 mL via INTRATHECAL

## 2020-02-21 MED ORDER — LACTATED RINGERS IV SOLN: INTRAVENOUS | Status: DC | PRN

## 2020-02-21 MED ORDER — OXYCODONE HCL 5 MG/5ML PO SOLN: 5.0000 mg | Freq: Once | ORAL | Status: DC | PRN

## 2020-02-21 MED ORDER — METHOCARBAMOL 500 MG PO TABS: 500.0000 mg | ORAL_TABLET | Freq: Four times a day (QID) | ORAL | Status: DC | PRN

## 2020-02-21 MED ORDER — OXYCODONE HCL 5 MG PO TABS: 5.0000 mg | ORAL_TABLET | Freq: Once | ORAL | Status: DC | PRN

## 2020-02-21 MED ORDER — METOCLOPRAMIDE HCL 5 MG PO TABS: 5.0000 mg | ORAL_TABLET | Freq: Three times a day (TID) | ORAL | Status: DC | PRN

## 2020-02-21 MED ORDER — CEFAZOLIN SODIUM-DEXTROSE 2-4 GM/100ML-% IV SOLN
2.0000 g | INTRAVENOUS | Status: AC
  Administered 2020-02-21: 2 g via INTRAVENOUS
  Filled 2020-02-21: qty 100

## 2020-02-21 MED ORDER — PROPOFOL 500 MG/50ML IV EMUL
INTRAVENOUS | Status: DC | PRN
  Administered 2020-02-21: 100 ug/kg/min via INTRAVENOUS

## 2020-02-21 MED ORDER — SODIUM CHLORIDE 0.9 % IV SOLN: INTRAVENOUS | Status: DC

## 2020-02-21 MED ORDER — PHENYLEPHRINE 40 MCG/ML (10ML) SYRINGE FOR IV PUSH (FOR BLOOD PRESSURE SUPPORT)
PREFILLED_SYRINGE | INTRAVENOUS | Status: AC
  Filled 2020-02-21: qty 10

## 2020-02-21 MED ORDER — CEFAZOLIN SODIUM-DEXTROSE 1-4 GM/50ML-% IV SOLN
1.0000 g | Freq: Four times a day (QID) | INTRAVENOUS | Status: AC
  Administered 2020-02-21 (×2): 1 g via INTRAVENOUS
  Filled 2020-02-21 (×2): qty 50

## 2020-02-21 MED ORDER — ONDANSETRON HCL 4 MG PO TABS
4.0000 mg | ORAL_TABLET | Freq: Four times a day (QID) | ORAL | Status: DC | PRN
  Administered 2020-02-22: 4 mg via ORAL
  Filled 2020-02-21: qty 1

## 2020-02-21 MED ORDER — CHLORHEXIDINE GLUCONATE 0.12 % MT SOLN: 15.0000 mL | Freq: Once | OROMUCOSAL | Status: AC

## 2020-02-21 SURGICAL SUPPLY — 57 items
BENZOIN TINCTURE PRP APPL 2/3 (GAUZE/BANDAGES/DRESSINGS) ×3 IMPLANT
BLADE CLIPPER SURG (BLADE) IMPLANT
BLADE SAW SGTL 18X1.27X75 (BLADE) ×2 IMPLANT
BLADE SAW SGTL 18X1.27X75MM (BLADE) ×1
CLOSURE WOUND 1/2 X4 (GAUZE/BANDAGES/DRESSINGS)
COVER SURGICAL LIGHT HANDLE (MISCELLANEOUS) ×3 IMPLANT
COVER WAND RF STERILE (DRAPES) IMPLANT
CUP ACET PINNACLE SECTR 48MM (Joint) ×1 IMPLANT
DRAPE C-ARM 42X72 X-RAY (DRAPES) ×3 IMPLANT
DRAPE STERI IOBAN 125X83 (DRAPES) ×3 IMPLANT
DRAPE U-SHAPE 47X51 STRL (DRAPES) ×9 IMPLANT
DRESSING AQUACEL AG SP 3.5X10 (GAUZE/BANDAGES/DRESSINGS) ×1 IMPLANT
DRSG AQUACEL AG ADV 3.5X10 (GAUZE/BANDAGES/DRESSINGS) ×3 IMPLANT
DRSG AQUACEL AG SP 3.5X10 (GAUZE/BANDAGES/DRESSINGS) ×3
DURAPREP 26ML APPLICATOR (WOUND CARE) ×3 IMPLANT
ELECT BLADE 4.0 EZ CLEAN MEGAD (MISCELLANEOUS) ×3
ELECT BLADE 6.5 EXT (BLADE) IMPLANT
ELECT REM PT RETURN 9FT ADLT (ELECTROSURGICAL) ×3
ELECTRODE BLDE 4.0 EZ CLN MEGD (MISCELLANEOUS) ×1 IMPLANT
ELECTRODE REM PT RTRN 9FT ADLT (ELECTROSURGICAL) ×1 IMPLANT
FACESHIELD WRAPAROUND (MASK) ×6 IMPLANT
GLOVE BIOGEL PI IND STRL 8 (GLOVE) ×2 IMPLANT
GLOVE BIOGEL PI INDICATOR 8 (GLOVE) ×4
GLOVE ECLIPSE 8.0 STRL XLNG CF (GLOVE) ×3 IMPLANT
GLOVE ORTHO TXT STRL SZ7.5 (GLOVE) ×6 IMPLANT
GOWN STRL REUS W/ TWL LRG LVL3 (GOWN DISPOSABLE) ×2 IMPLANT
GOWN STRL REUS W/ TWL XL LVL3 (GOWN DISPOSABLE) ×2 IMPLANT
GOWN STRL REUS W/TWL LRG LVL3 (GOWN DISPOSABLE) ×4
GOWN STRL REUS W/TWL XL LVL3 (GOWN DISPOSABLE) ×4
HANDPIECE INTERPULSE COAX TIP (DISPOSABLE) ×2
HEAD FEMORAL 32 CERAMIC (Hips) ×3 IMPLANT
KIT BASIN OR (CUSTOM PROCEDURE TRAY) ×3 IMPLANT
KIT TURNOVER KIT B (KITS) ×3 IMPLANT
LINER ACET 32X48 (Liner) ×3 IMPLANT
MANIFOLD NEPTUNE II (INSTRUMENTS) ×3 IMPLANT
NS IRRIG 1000ML POUR BTL (IV SOLUTION) ×3 IMPLANT
PACK TOTAL JOINT (CUSTOM PROCEDURE TRAY) ×3 IMPLANT
PAD ARMBOARD 7.5X6 YLW CONV (MISCELLANEOUS) ×3 IMPLANT
PINNSECTOR W/GRIP ACE CUP 48MM (Joint) ×3 IMPLANT
SET HNDPC FAN SPRY TIP SCT (DISPOSABLE) ×1 IMPLANT
STAPLER VISISTAT 35W (STAPLE) IMPLANT
STEM FEM ACTIS STD SZ0 (Stem) ×3 IMPLANT
STRIP CLOSURE SKIN 1/2X4 (GAUZE/BANDAGES/DRESSINGS) IMPLANT
SUT ETHIBOND NAB CT1 #1 30IN (SUTURE) ×3 IMPLANT
SUT MNCRL AB 4-0 PS2 18 (SUTURE) IMPLANT
SUT VIC AB 0 CT1 27 (SUTURE) ×2
SUT VIC AB 0 CT1 27XBRD ANBCTR (SUTURE) ×1 IMPLANT
SUT VIC AB 1 CT1 27 (SUTURE) ×2
SUT VIC AB 1 CT1 27XBRD ANBCTR (SUTURE) ×1 IMPLANT
SUT VIC AB 2-0 CT1 27 (SUTURE) ×2
SUT VIC AB 2-0 CT1 TAPERPNT 27 (SUTURE) ×1 IMPLANT
TOWEL GREEN STERILE (TOWEL DISPOSABLE) ×3 IMPLANT
TOWEL GREEN STERILE FF (TOWEL DISPOSABLE) ×3 IMPLANT
TRAY CATH 16FR W/PLASTIC CATH (SET/KITS/TRAYS/PACK) IMPLANT
TRAY FOLEY W/BAG SLVR 16FR (SET/KITS/TRAYS/PACK)
TRAY FOLEY W/BAG SLVR 16FR ST (SET/KITS/TRAYS/PACK) IMPLANT
WATER STERILE IRR 1000ML POUR (IV SOLUTION) ×6 IMPLANT

## 2020-02-21 NOTE — Anesthesia Postprocedure Evaluation (Signed)
Anesthesia Post Note  Patient: Joyce Alvarez  Procedure(s) Performed: RIGHT TOTAL HIP ARTHROPLASTY ANTERIOR APPROACH (Right Hip)     Patient location during evaluation: PACU Anesthesia Type: Spinal Level of consciousness: awake and alert Pain management: pain level controlled Vital Signs Assessment: post-procedure vital signs reviewed and stable Respiratory status: spontaneous breathing and respiratory function stable Cardiovascular status: blood pressure returned to baseline and stable Postop Assessment: spinal receding and no apparent nausea or vomiting Anesthetic complications: no   No complications documented.  Last Vitals:  Vitals:   02/21/20 1609 02/21/20 1626  BP: 112/69 118/65  Pulse: 60 70  Resp: 10 11  Temp: 36.6 C 36.7 C  SpO2: 100% 100%    Last Pain:  Vitals:   02/21/20 1626  TempSrc: Oral  PainSc: 5                  Beryle Lathe

## 2020-02-21 NOTE — Anesthesia Procedure Notes (Signed)
Spinal  Patient location during procedure: OR Start time: 02/21/2020 1:05 PM End time: 02/21/2020 1:09 PM Staffing Performed: anesthesiologist  Anesthesiologist: Beryle Lathe, MD Preanesthetic Checklist Completed: patient identified, IV checked, risks and benefits discussed, surgical consent, monitors and equipment checked, pre-op evaluation and timeout performed Spinal Block Patient position: sitting Prep: DuraPrep Patient monitoring: heart rate, cardiac monitor, continuous pulse ox and blood pressure Approach: midline Location: L3-4 Injection technique: single-shot Needle Needle type: Pencan  Needle gauge: 24 G Additional Notes Consent was obtained prior to the procedure with all questions answered and concerns addressed. Risks including, but not limited to, bleeding, infection, nerve damage, paralysis, failed block, inadequate analgesia, allergic reaction, high spinal, itching, and headache were discussed and the patient wished to proceed. Functioning IV was confirmed and monitors were applied. Sterile prep and drape, including hand hygiene, mask, and sterile gloves were used. The patient was positioned and the spine was prepped. The skin was anesthetized with lidocaine. Free flow of clear CSF was obtained prior to injecting local anesthetic into the CSF. The spinal needle aspirated freely following injection. The needle was carefully withdrawn. The patient tolerated the procedure well.   Leslye Peer, MD

## 2020-02-21 NOTE — Transfer of Care (Signed)
Immediate Anesthesia Transfer of Care Note  Patient: Joyce Alvarez  Procedure(s) Performed: RIGHT TOTAL HIP ARTHROPLASTY ANTERIOR APPROACH (Right Hip)  Patient Location: PACU  Anesthesia Type:Spinal  Level of Consciousness: awake, alert  and oriented  Airway & Oxygen Therapy: Patient Spontanous Breathing  Post-op Assessment: Report given to RN and Post -op Vital signs reviewed and stable  Post vital signs: Reviewed and stable  Last Vitals:  Vitals Value Taken Time  BP 95/66 02/21/20 1454  Temp    Pulse 72 02/21/20 1454  Resp 8 02/21/20 1454  SpO2 100 % 02/21/20 1454  Vitals shown include unvalidated device data.  Last Pain:  Vitals:   02/21/20 1055  TempSrc:   PainSc: 0-No pain         Complications: No complications documented.

## 2020-02-21 NOTE — Anesthesia Procedure Notes (Signed)
Procedure Name: MAC Date/Time: 02/21/2020 1:05 PM Performed by: Alain Marion, CRNA Pre-anesthesia Checklist: Patient identified, Emergency Drugs available, Suction available, Patient being monitored and Timeout performed Patient Re-evaluated:Patient Re-evaluated prior to induction Oxygen Delivery Method: Simple face mask Induction Type: IV induction Placement Confirmation: positive ETCO2

## 2020-02-21 NOTE — Anesthesia Preprocedure Evaluation (Addendum)
Anesthesia Evaluation  Patient identified by MRN, date of birth, ID band Patient awake    Reviewed: Allergy & Precautions, NPO status , Patient's Chart, lab work & pertinent test results  History of Anesthesia Complications Negative for: history of anesthetic complications  Airway Mallampati: II  TM Distance: >3 FB Neck ROM: Full    Dental  (+) Dental Advisory Given, Teeth Intact   Pulmonary former smoker,    Pulmonary exam normal        Cardiovascular negative cardio ROS Normal cardiovascular exam     Neuro/Psych negative neurological ROS  negative psych ROS   GI/Hepatic negative GI ROS, Neg liver ROS,   Endo/Other   Obesity   Renal/GU negative Renal ROS     Musculoskeletal  (+) Arthritis ,   Abdominal   Peds  Hematology  (+) anemia ,   Anesthesia Other Findings Covid test negative   Reproductive/Obstetrics                          Anesthesia Physical Anesthesia Plan  ASA: II  Anesthesia Plan: Spinal   Post-op Pain Management:    Induction:   PONV Risk Score and Plan: 2 and Treatment may vary due to age or medical condition and Propofol infusion  Airway Management Planned: Natural Airway and Simple Face Mask  Additional Equipment: None  Intra-op Plan:   Post-operative Plan:   Informed Consent: I have reviewed the patients History and Physical, chart, labs and discussed the procedure including the risks, benefits and alternatives for the proposed anesthesia with the patient or authorized representative who has indicated his/her understanding and acceptance.       Plan Discussed with: CRNA and Anesthesiologist  Anesthesia Plan Comments: (Labs reviewed, platelets acceptable. Discussed risks and benefits of spinal, including spinal/epidural hematoma, infection, failed block, and PDPH. Patient expressed understanding and wished to proceed. )       Anesthesia Quick  Evaluation

## 2020-02-21 NOTE — Brief Op Note (Signed)
02/21/2020  2:38 PM  PATIENT:  Joyce Alvarez  54 y.o. female  PRE-OPERATIVE DIAGNOSIS:  osteoarthritis right hip  POST-OPERATIVE DIAGNOSIS:  osteoarthritis right hip  PROCEDURE:  Procedure(s): RIGHT TOTAL HIP ARTHROPLASTY ANTERIOR APPROACH (Right)  SURGEON:  Surgeon(s) and Role:    Kathryne Hitch, MD - Primary  PHYSICIAN ASSISTANT:  Rexene Edison, PA-C  ANESTHESIA:   spinal  EBL:  200 mL   COUNTS:  YES  DICTATION: .Other Dictation: Dictation Number [id number]  PLAN OF CARE: Admit for overnight observation  PATIENT DISPOSITION:  PACU - hemodynamically stable.   Delay start of Pharmacological VTE agent (>24hrs) due to surgical blood loss or risk of bleeding: no

## 2020-02-21 NOTE — Evaluation (Signed)
Physical Therapy Evaluation Patient Details Name: Joyce Alvarez MRN: [id number] DOB: [birth year] Today's Date: 02/21/2020   History of Present Illness  Pt is a 54 y/o female s/p R THA, direct anterior. PMH includes L THA.   Clinical Impression  Pt is s/p surgery above with deficits below. Requiring min guard to supervision assist for mobility with RW. Pt reporting lightheadedness which limited ambulation tolerance; notified RN. Reports husband can assist as needed at d/c. Will continue to follow acutely.     Follow Up Recommendations Follow surgeon's recommendation for DC plan and follow-up therapies    Equipment Recommendations  None recommended by PT    Recommendations for Other Services       Precautions / Restrictions Precautions Precautions: None Restrictions Weight Bearing Restrictions: Yes RLE Weight Bearing: Weight bearing as tolerated      Mobility  Bed Mobility Overal bed mobility: Needs Assistance Bed Mobility: Supine to Sit     Supine to sit: Min assist     General bed mobility comments: Min A for trunk assist and LE assist.     Transfers Overall transfer level: Needs assistance Equipment used: Rolling walker (2 wheeled) Transfers: Sit to/from Stand Sit to Stand: Min guard         General transfer comment: Min guard for safety.   Ambulation/Gait Ambulation/Gait assistance: Min guard;Supervision Gait Distance (Feet): 15 Feet Assistive device: Rolling walker (2 wheeled) Gait Pattern/deviations: Step-to pattern;Decreased step length - right;Decreased step length - left;Decreased weight shift to right;Antalgic Gait velocity: Decreased   General Gait Details: Antalgic gait within the room. Min guard to supervision for safety. Pt reporting lightheadedness, so required seated rest.   Stairs            Wheelchair Mobility    Modified Rankin (Stroke Patients Only)       Balance Overall balance assessment: Needs  assistance Sitting-balance support: No upper extremity supported;Feet supported Sitting balance-Leahy Scale: Good     Standing balance support: Bilateral upper extremity supported;During functional activity Standing balance-Leahy Scale: Poor Standing balance comment: Reliant on BUE support                              Pertinent Vitals/Pain Pain Assessment: 0-10 Pain Score: 8  Pain Location: R hip  Pain Descriptors / Indicators: Aching;Operative site guarding Pain Intervention(s): Limited activity within patient's tolerance;Monitored during session;Repositioned    Home Living Family/patient expects to be discharged to:: Private residence Living Arrangements: Spouse/significant other Available Help at Discharge: Family;Available 24 hours/day Type of Home: House Home Access: Stairs to enter Entrance Stairs-Rails: Can reach both;Left;Right Entrance Stairs-Number of Steps: 3-4 Home Layout: Multi-level;Able to live on main level with bedroom/bathroom Home Equipment: Walker - 2 wheels;Grab bars - toilet;Shower seat      Prior Function Level of Independence: Independent               Hand Dominance        Extremity/Trunk Assessment   Upper Extremity Assessment Upper Extremity Assessment: Overall WFL for tasks assessed    Lower Extremity Assessment Lower Extremity Assessment: RLE deficits/detail RLE Deficits / Details: Deficits consistent with post op pain and weakness.     Cervical / Trunk Assessment Cervical / Trunk Assessment: Normal  Communication   Communication: No difficulties  Cognition Arousal/Alertness: Awake/alert Behavior During Therapy: WFL for tasks assessed/performed Overall Cognitive Status: Within Functional Limits for tasks assessed  General Comments General comments (skin integrity, edema, etc.): Pt's husband present during session.     Exercises     Assessment/Plan    PT  Assessment Patient needs continued PT services  PT Problem List Decreased strength;Decreased balance       PT Treatment Interventions DME instruction;Gait training;Stair training;Functional mobility training;Therapeutic activities;Balance training;Therapeutic exercise;Patient/family education    PT Goals (Current goals can be found in the Care Plan section)  Acute Rehab PT Goals Patient Stated Goal: to go home PT Goal Formulation: With patient Time For Goal Achievement: 03/06/20 Potential to Achieve Goals: Good    Frequency 7X/week   Barriers to discharge        Co-evaluation               AM-PAC PT "6 Clicks" Mobility  Outcome Measure Help needed turning from your back to your side while in a flat bed without using bedrails?: None Help needed moving from lying on your back to sitting on the side of a flat bed without using bedrails?: A Little Help needed moving to and from a bed to a chair (including a wheelchair)?: A Little Help needed standing up from a chair using your arms (e.g., wheelchair or bedside chair)?: A Little Help needed to walk in hospital room?: A Little Help needed climbing 3-5 steps with a railing? : A Lot 6 Click Score: 18    End of Session Equipment Utilized During Treatment: Gait belt Activity Tolerance: Treatment limited secondary to medical complications (Comment) (lightheadedness) Patient left: in chair;with call bell/phone within reach;with family/visitor present Nurse Communication: Mobility status (Pt with lightheadedness) PT Visit Diagnosis: Other abnormalities of gait and mobility (R26.89);Pain Pain - Right/Left: Right Pain - part of body: Hip    Time: 1653-1710 PT Time Calculation (min) (ACUTE ONLY): 17 min   Charges:   PT Evaluation $PT Eval Low Complexity: 1 Low          Cindee Salt, DPT  Acute Rehabilitation Services  Pager: [phone] Office: [phone]   Lehman Prom 02/21/2020, 5:16 PM

## 2020-02-21 NOTE — H&P (Signed)
TOTAL HIP ADMISSION H&P  Patient is admitted for right total hip arthroplasty.  Subjective:  Chief Complaint: right hip pain  HPI: Joyce Alvarez, 54 y.o. female, has a history of pain and functional disability in the right hip(s) due to arthritis and patient has failed non-surgical conservative treatments for greater than 12 weeks to include NSAID's and/or analgesics, corticosteriod injections, flexibility and strengthening excercises, weight reduction as appropriate and activity modification.  Onset of symptoms was gradual starting 1 years ago with gradually worsening course since that time.The patient noted no past surgery on the right hip(s).  Patient currently rates pain in the right hip at 10 out of 10 with activity. Patient has night pain, worsening of pain with activity and weight bearing, trendelenberg gait, pain that interfers with activities of daily living and pain with passive range of motion. Patient has evidence of subchondral sclerosis, periarticular osteophytes and joint space narrowing by imaging studies. This condition presents safety issues increasing the risk of falls.  There is no current active infection.  Patient Active Problem List   Diagnosis Date Noted  . Status post total replacement of left hip 11/18/2019  . Unilateral primary osteoarthritis, left hip 10/19/2019  . Unilateral primary osteoarthritis, right hip 10/19/2019   Past Medical History:  Diagnosis Date  . Anemia   . Arthritis     Past Surgical History:  Procedure Laterality Date  . APPENDECTOMY    . BREAST BIOPSY Right   . BREAST REDUCTION SURGERY    . caesarean section    . JOINT REPLACEMENT    . STERIOD INJECTION Right 11/18/2019   Procedure: STEROID INJECTION right hip;  Surgeon: Kathryne Hitch, MD;  Location: [facility];  Service: Orthopedics;  Laterality: Right;  . TOTAL HIP ARTHROPLASTY Left 11/18/2019   Procedure: LEFT TOTAL HIP ARTHROPLASTY ANTERIOR APPROACH;  Surgeon: Kathryne Hitch, MD;  Location: [facility];  Service: Orthopedics;  Laterality: Left;  . WISDOM TOOTH EXTRACTION      Current Facility-Administered Medications  Medication Dose Route Frequency Provider Last Rate Last Admin  . ceFAZolin (ANCEF) IVPB 2g/100 mL premix  2 g Intravenous On Call to OR Kirtland Bouchard, PA-C      . lactated ringers infusion   Intravenous Continuous Beryle Lathe, MD 10 mL/hr at 02/21/20 1107 New Bag at 02/21/20 1107  . tranexamic acid (CYKLOKAPRON) IVPB 1,000 mg  1,000 mg Intravenous To OR Kirtland Bouchard, PA-C       No Known Allergies  Social History   Tobacco Use  . Smoking status: Former Smoker    Packs/day: 0.25    Years: 10.00    Pack years: 2.50  . Smokeless tobacco: Never Used  . Tobacco comment: Quit 1995  Substance Use Topics  . Alcohol use: Yes    Comment:  WEKENDS    No family history on file.   Review of Systems  Musculoskeletal: Positive for gait problem.  All other systems reviewed and are negative.   Objective:  Physical Exam Vitals reviewed.  Constitutional:      Appearance: Normal appearance.  HENT:     Head: Normocephalic and atraumatic.  Eyes:     Extraocular Movements: Extraocular movements intact.     Pupils: Pupils are equal, round, and reactive to light.  Cardiovascular:     Rate and Rhythm: Normal rate and regular rhythm.     Pulses: Normal pulses.  Pulmonary:     Effort: Pulmonary effort is normal.     Breath  sounds: Normal breath sounds.  Abdominal:     Palpations: Abdomen is soft.  Musculoskeletal:     Cervical back: Normal range of motion and neck supple.     Right hip: Tenderness and bony tenderness present. Decreased range of motion. Decreased strength.  Neurological:     Mental Status: She is alert and oriented to person, place, and time.  Psychiatric:        Behavior: Behavior normal.     Vital signs in last 24 hours: Temp:  [98 F (36.7 C)] 98 F (36.7 C) (11/30 1029) Pulse Rate:  [85] 85 (11/30  1029) Resp:  [18] 18 (11/30 1029) BP: (136)/(62) 136/62 (11/30 1029) SpO2:  [99 %] 99 % (11/30 1029) Weight:  [68 kg] 68 kg (11/30 1029)  Labs:   Estimated body mass index is 30.3 kg/m as calculated from the following:   Height as of this encounter: 4\' 11"  (1.499 m).   Weight as of this encounter: 68 kg.   Imaging Review Plain radiographs demonstrate severe degenerative joint disease of the right hip(s). The bone quality appears to be excellent for age and reported activity level.      Assessment/Plan:  End stage arthritis, right hip(s)  The patient history, physical examination, clinical judgement of the provider and imaging studies are consistent with end stage degenerative joint disease of the right hip(s) and total hip arthroplasty is deemed medically necessary. The treatment options including medical management, injection therapy, arthroscopy and arthroplasty were discussed at length. The risks and benefits of total hip arthroplasty were presented and reviewed. The risks due to aseptic loosening, infection, stiffness, dislocation/subluxation,  thromboembolic complications and other imponderables were discussed.  The patient acknowledged the explanation, agreed to proceed with the plan and consent was signed. Patient is being admitted for inpatient treatment for surgery, pain control, PT, OT, prophylactic antibiotics, VTE prophylaxis, progressive ambulation and ADL's and discharge planning.The patient is planning to be discharged home with home health services

## 2020-02-22 ENCOUNTER — Encounter (HOSPITAL_COMMUNITY): Payer: Self-pay | Admitting: Orthopaedic Surgery

## 2020-02-22 DIAGNOSIS — M1611 Unilateral primary osteoarthritis, right hip: Secondary | ICD-10-CM | POA: Diagnosis not present

## 2020-02-22 LAB — CBC
HCT: 30.6 % — ABNORMAL LOW (ref 36.0–46.0)
Hemoglobin: 10.3 g/dL — ABNORMAL LOW (ref 12.0–15.0)
MCH: 29.5 pg (ref 26.0–34.0)
MCHC: 33.7 g/dL (ref 30.0–36.0)
MCV: 87.7 fL (ref 80.0–100.0)
Platelets: 284 10*3/uL (ref 150–400)
RBC: 3.49 MIL/uL — ABNORMAL LOW (ref 3.87–5.11)
RDW: 13.2 % (ref 11.5–15.5)
WBC: 16.5 10*3/uL — ABNORMAL HIGH (ref 4.0–10.5)
nRBC: 0 % (ref 0.0–0.2)

## 2020-02-22 LAB — BASIC METABOLIC PANEL
Anion gap: 10 (ref 5–15)
BUN: 14 mg/dL (ref 6–20)
CO2: 22 mmol/L (ref 22–32)
Calcium: 8.6 mg/dL — ABNORMAL LOW (ref 8.9–10.3)
Chloride: 98 mmol/L (ref 98–111)
Creatinine, Ser: 0.69 mg/dL (ref 0.44–1.00)
GFR, Estimated: >60 mL/min (ref >60)
Glucose, Bld: 125 mg/dL — ABNORMAL HIGH (ref 70–99)
Potassium: 4.1 mmol/L (ref 3.5–5.1)
Sodium: 130 mmol/L — ABNORMAL LOW (ref 135–145)

## 2020-02-22 MED ORDER — ASPIRIN 81 MG PO CHEW
81.0000 mg | CHEWABLE_TABLET | Freq: Two times a day (BID) | ORAL | 0 refills | Status: DC
Start: 2020-02-22 — End: 2020-02-29

## 2020-02-22 MED ORDER — METHOCARBAMOL 500 MG PO TABS
500.0000 mg | ORAL_TABLET | Freq: Four times a day (QID) | ORAL | 1 refills | Status: DC | PRN
Start: 2020-02-22 — End: 2020-04-04

## 2020-02-22 MED ORDER — OXYCODONE HCL 5 MG PO TABS
5.0000 mg | ORAL_TABLET | ORAL | 0 refills | Status: DC | PRN
Start: 2020-02-22 — End: 2020-02-28

## 2020-02-22 NOTE — TOC Initial Note (Signed)
Transition of Care [facility]) - Initial/Assessment Note    Patient Details  Name: Joyce Alvarez MRN: [id number] Date of Birth: 02/06/1966  Transition of Care [facility]) CM/SW Contact:    Curlene Labrum, RN Phone Number: 02/22/2020, 10:47 AM  Clinical Narrative:                 Case management met with the patient at the bedside - s/P Right hip arthroplasty - anterior repair by Dr. Ninfa Linden.  She recently had the Left side done in August 2021 and has dme at home including RW and raised toilet seat.  The patient received home health through [facility] in [place] - I called the agency and they are accepting the patient back and faxed all clinicals and PT orders to fax # [phone].  Patient is discharging home with husband today - set up with home health and dme.  Expected Discharge Plan: [place] Barriers to Discharge: No Barriers Identified   Patient Goals and CMS Choice Patient states their goals for this hospitalization and ongoing recovery are:: Patient plans to discharge home with husband today. CMS Medicare.gov Compare Post Acute Care list provided to:: Patient Choice offered to / list presented to : Patient  Expected Discharge Plan and Services Expected Discharge Plan: [place]   Discharge Planning Services: CM Consult Post Acute Care Choice: [place] arrangements for the past 2 months: [place] Expected Discharge Date: 02/22/20                         [facility] Arranged: PT [place] Agency:  (set up with [facility] in [place], [place] - [phone], fax [phone]) Date [place]: 02/22/20 Time [place]: 54 Representative spoke with at [place]: [place]  Prior Living Arrangements/Services Living arrangements for the past 2 months: [place] with:: Spouse   Do you feel safe going back to the place where you live?: Yes      Need for Family Participation in  Patient Care: Yes (Comment) Care giver support system in place?: Yes (comment) Current home services: DME (has RW and raised toilet seat at home.) Criminal Activity/Legal Involvement Pertinent to Current Situation/Hospitalization: No - Comment as needed  Activities of Daily Living Home Assistive Devices/Equipment: Environmental consultant (specify type), Cane (specify quad or straight) ADL Screening (condition at time of admission) Patient's cognitive ability adequate to safely complete daily activities?: Yes Is the patient deaf or have difficulty hearing?: No Does the patient have difficulty seeing, even when wearing glasses/contacts?: No Does the patient have difficulty concentrating, remembering, or making decisions?: No Patient able to express need for assistance with ADLs?: Yes Does the patient have difficulty dressing or bathing?: No Independently performs ADLs?: Yes (appropriate for developmental age) Does the patient have difficulty walking or climbing stairs?: No Weakness of Legs: Right Weakness of Arms/Hands: None  Permission Sought/Granted Permission sought to share information with : Case Manager Permission granted to share information with : Yes, Verbal Permission Granted     Permission granted to share info w AGENCY: [facility] in [place] granted to share info w Relationship: spouse     Emotional Assessment Appearance:: Appears stated age Attitude/Demeanor/Rapport: Gracious Affect (typically observed): Accepting Orientation: : Oriented to Self, Oriented to Place, Oriented to  Time, Oriented to Situation Alcohol / Substance Use: Not Applicable Psych Involvement: No (comment)  Admission diagnosis:  Status  post total replacement of right hip [Z96.641] Patient Active Problem List   Diagnosis Date Noted  . Status post total replacement of right hip 02/21/2020  . Status post total replacement of left hip 11/18/2019  . Unilateral primary osteoarthritis, left hip  10/19/2019  . Unilateral primary osteoarthritis, right hip 10/19/2019   PCP:  [place]:   [facility] [phone] [facility] [employer] - [place], [facility] [place] [address] Phone: [phone] Fax: [phone]     Social Determinants of Health (SDOH) Interventions    Readmission Risk Interventions No flowsheet data found.

## 2020-02-22 NOTE — Progress Notes (Signed)
Physical Therapy Treatment Patient Details Name: Joyce Alvarez MRN: [id number] DOB: 05/28/1965 Today's Date: 02/22/2020    History of Present Illness Pt is a 54 y/o female s/p R THA, direct anterior. PMH includes L THA.     PT Comments    Pt supine on arrival, agreeable to therapy session, with good participation and tolerance for mobility compared with previous session. Pt progressed gait distance to 173ft using RW and Supervision prior to needing seated rest break. Pt performed stair trial x4 steps with min guard and good tolerance, no LOB. Pt performed supine A/AAROM therapeutic exercises, with only minimal difficulty for hip abduction (needing AA) and minimal c/o pain throughout. Pt VSS on RA during mobility, orthostatics taken and BP stable. Pt continues to benefit from PT services to progress toward functional mobility goals. D/C recs below, anticipate pt safe to D/C home once medically cleared, will continue to follow acutely.   Follow Up Recommendations  Follow surgeons recommendation for DC plan and follow-up therapies     Equipment Recommendations  None recommended by PT    Recommendations for Other Services       Precautions / Restrictions Precautions Precautions: None Restrictions Weight Bearing Restrictions: Yes RLE Weight Bearing: Weight bearing as tolerated    Mobility  Bed Mobility Overal bed mobility: Needs Assistance Bed Mobility: Supine to Sit     Supine to sit: Supervision     General bed mobility comments: use of bed features, no physical assist needed  Transfers Overall transfer level: Needs assistance Equipment used: Rolling walker (2 wheeled) Transfers: Sit to/from Stand Sit to Stand: Supervision;Min guard         General transfer comment: Min guard for safety initially progressing to Supervision  Ambulation/Gait Ambulation/Gait assistance: Supervision Gait Distance (Feet): 150 Feet (67ft, seated break, 120ft, seated break,  83ft) Assistive device: Rolling walker (2 wheeled) Gait Pattern/deviations: Step-through pattern;Decreased step length - right;Trendelenburg Gait velocity: Decreased   General Gait Details: mild bilateral trendelenburg vs antalgic gait pattern, cues for sequencing to reduce pain, pt able to tolerate short step-through gait pattern; pt denies dizziness and HR/SpO2 stable, orthostatics stable   Stairs Stairs: Yes Stairs assistance: Min guard Stair Management: One rail Right;Step to pattern (min guard via HHA on LUE to simulate second rail) Number of Stairs: 4 General stair comments: pt ascended/descended 4 steps with good recall of sequencing facing forward, no LOB, good technique   Wheelchair Mobility    Modified Rankin (Stroke Patients Only)       Balance Overall balance assessment: Needs assistance Sitting-balance support: No upper extremity supported;Feet supported Sitting balance-Leahy Scale: Good Sitting balance - Comments: pt sat EOB unsupported no difficulty   Standing balance support: Bilateral upper extremity supported;During functional activity Standing balance-Leahy Scale: Fair Standing balance comment: Reliant on BUE support for dynamic standing tasks 2/2 RLE pain, but able to don mask in static stance unsupported no LOB                            Cognition Arousal/Alertness: Awake/alert Behavior During Therapy: WFL for tasks assessed/performed Overall Cognitive Status: Within Functional Limits for tasks assessed                                 General Comments: cooperative      Exercises Total Joint Exercises Ankle Circles/Pumps: AROM;Both;15 reps;Supine Quad Sets: AROM;Strengthening;Both;10 reps;Supine Short Arc Quad: AROM;Right;10  reps;Supine Heel Slides: AROM;Right;10 reps;Supine Hip ABduction/ADduction: AROM;Right;10 reps;Supine Long Arc Quad: AROM;Right;5 reps;Seated Marching in Standing:  (pt defer 2/2 fatigue after  gait/stairs, verbal review) Standing Hip Extension:  (pt defer 2/2 fatigue after gait/stairs, verbal review)    General Comments General comments (skin integrity, edema, etc.): pt spouse present t/o session and provided chair follow for safety      Pertinent Vitals/Pain Pain Assessment: 0-10 Pain Score: 2  (seems to increase with mobility but pt did not update score) Pain Location: R hip  Pain Descriptors / Indicators: Sore Pain Intervention(s): Monitored during session;Premedicated before session;Repositioned;Ice applied   Orthostatic BPs  Supine 134/79 (92)  Sitting 133/67 (86)  Standing 131/67   Home Living                      Prior Function            PT Goals (current goals can now be found in the care plan section) Acute Rehab PT Goals Patient Stated Goal: to go home PT Goal Formulation: With patient Time For Goal Achievement: 03/06/20 Potential to Achieve Goals: Good Progress towards PT goals: Progressing toward goals    Frequency    7X/week      PT Plan Current plan remains appropriate    Co-evaluation              AM-PAC PT "6 Clicks" Mobility   Outcome Measure  Help needed turning from your back to your side while in a flat bed without using bedrails?: None Help needed moving from lying on your back to sitting on the side of a flat bed without using bedrails?: None Help needed moving to and from a bed to a chair (including a wheelchair)?: None Help needed standing up from a chair using your arms (e.g., wheelchair or bedside chair)?: None Help needed to walk in hospital room?: None Help needed climbing 3-5 steps with a railing? : A Little 6 Click Score: 23    End of Session Equipment Utilized During Treatment: Gait belt Activity Tolerance: Patient tolerated treatment well Patient left: in chair;with call bell/phone within reach;with family/visitor present Nurse Communication: Mobility status PT Visit Diagnosis: Other abnormalities  of gait and mobility (R26.89);Pain Pain - Right/Left: Right Pain - part of body: Hip     Time: 1700-1749 PT Time Calculation (min) (ACUTE ONLY): 38 min  Charges:  $Gait Training: 8-22 mins $Therapeutic Exercise: 8-22 mins $Therapeutic Activity: 8-22 mins                     Shemar Plemmons P., PTA Acute Rehabilitation Services Pager: [phone] Office: [phone]   Dorathy Kinsman Aubree Doody 02/22/2020, 10:28 AM

## 2020-02-22 NOTE — Discharge Instructions (Signed)

## 2020-02-22 NOTE — Progress Notes (Signed)
Pt was given the AVS discharge summary and I went over it with her. IV was removed with catheter intact. Pt voided after having catheter removed. Pt had no further questions.

## 2020-02-22 NOTE — Plan of Care (Signed)
Pt did well with PT today before going home. She also voided after having foley pulled this morning. Pt states she is ready to go home.   Problem: Education: Goal: Knowledge of General Education information will improve Description: Including pain rating scale, medication(s)/side effects and non-pharmacologic comfort measures Outcome: Adequate for Discharge   Problem: Health Behavior/Discharge Planning: Goal: Ability to manage health-related needs will improve Outcome: Adequate for Discharge   Problem: Clinical Measurements: Goal: Ability to maintain clinical measurements within normal limits will improve Outcome: Adequate for Discharge Goal: Will remain free from infection Outcome: Adequate for Discharge Goal: Diagnostic test results will improve Outcome: Adequate for Discharge Goal: Respiratory complications will improve Outcome: Adequate for Discharge Goal: Cardiovascular complication will be avoided Outcome: Adequate for Discharge   Problem: Activity: Goal: Risk for activity intolerance will decrease Outcome: Adequate for Discharge   Problem: Nutrition: Goal: Adequate nutrition will be maintained Outcome: Adequate for Discharge   Problem: Coping: Goal: Level of anxiety will decrease Outcome: Adequate for Discharge   Problem: Elimination: Goal: Will not experience complications related to bowel motility Outcome: Adequate for Discharge Goal: Will not experience complications related to urinary retention Outcome: Adequate for Discharge   Problem: Pain Managment: Goal: General experience of comfort will improve Outcome: Adequate for Discharge   Problem: Safety: Goal: Ability to remain free from injury will improve Outcome: Adequate for Discharge   Problem: Skin Integrity: Goal: Risk for impaired skin integrity will decrease Outcome: Adequate for Discharge   Problem: Acute Rehab PT Goals(only PT should resolve) Goal: Pt Will Go Supine/Side To Sit Outcome:  Adequate for Discharge Goal: Pt Will Go Sit To Supine/Side Outcome: Adequate for Discharge Goal: Patient Will Transfer Sit To/From Stand Outcome: Adequate for Discharge Goal: Pt Will Ambulate Outcome: Adequate for Discharge Goal: Pt Will Go Up/Down Stairs Outcome: Adequate for Discharge

## 2020-02-22 NOTE — Discharge Summary (Signed)
Patient ID: Joyce Alvarez MRN: [id number] DOB/AGE: 1965-07-08 54 y.o.  Admit date: 02/21/2020 Discharge date: 02/22/2020  Admission Diagnoses:  Principal Problem:   Unilateral primary osteoarthritis, right hip Active Problems:   Status post total replacement of right hip   Discharge Diagnoses:  Same  Past Medical History:  Diagnosis Date  . Anemia   . Arthritis     Surgeries: Procedure(s): RIGHT TOTAL HIP ARTHROPLASTY ANTERIOR APPROACH on 02/21/2020   Consultants:   Discharged Condition: Improved  Hospital Course: Joyce Alvarez is an 54 y.o. female who was admitted 02/21/2020 for operative treatment ofUnilateral primary osteoarthritis, right hip. Patient has severe unremitting pain that affects sleep, daily activities, and work/hobbies. After pre-op clearance the patient was taken to the operating room on 02/21/2020 and underwent  Procedure(s): RIGHT TOTAL HIP ARTHROPLASTY ANTERIOR APPROACH.    Patient was given perioperative antibiotics:  Anti-infectives (From admission, onward)   Start     Dose/Rate Route Frequency Ordered Stop   02/21/20 1715  ceFAZolin (ANCEF) IVPB 1 g/50 mL premix        1 g 100 mL/hr over 30 Minutes Intravenous Every 6 hours 02/21/20 1626 02/21/20 2245   02/21/20 1015  ceFAZolin (ANCEF) IVPB 2g/100 mL premix        2 g 200 mL/hr over 30 Minutes Intravenous On call to O.R. 02/21/20 1011 02/21/20 1350       Patient was given sequential compression devices, early ambulation, and chemoprophylaxis to prevent DVT.  Patient benefited maximally from hospital stay and there were no complications.    Recent vital signs:  Patient Vitals for the past 24 hrs:  BP Temp Temp src Pulse Resp SpO2 Height Weight  02/22/20 0511 127/62 98.7 F (37.1 C) Oral 74 15 -- -- --  02/22/20 0004 118/63 98 F (36.7 C) Oral 72 17 97 % -- --  02/21/20 2024 (!) 111/53 98.4 F (36.9 C) Oral 65 16 95 % -- --  02/21/20 1830 97/69 98 F (36.7 C) Oral 66 17 100 % -- --   02/21/20 1759 (!) 114/56 -- -- 67 -- -- -- --  02/21/20 1730 (!) 88/45 97.7 F (36.5 C) Oral 66 17 93 % -- --  02/21/20 1626 118/65 98.1 F (36.7 C) Oral 70 11 100 % -- --  02/21/20 1609 112/69 97.9 F (36.6 C) -- 60 10 100 % -- --  02/21/20 1554 117/64 -- -- (!) 58 11 100 % -- --  02/21/20 1539 106/70 -- -- 60 12 100 % -- --  02/21/20 1524 96/68 -- -- (!) 58 12 99 % -- --  02/21/20 1509 106/70 -- -- 62 10 100 % -- --  02/21/20 1454 95/66 97.7 F (36.5 C) -- 81 (!) 9 99 % -- --  02/21/20 1029 136/62 98 F (36.7 C) Temporal 85 18 99 % 4\' 11"  (1.499 m) 68 kg     Recent laboratory studies:  Recent Labs    02/22/20 0158  WBC 16.5*  HGB 10.3*  HCT 30.6*  PLT 284  NA 130*  K 4.1  CL 98  CO2 22  BUN 14  CREATININE 0.69  GLUCOSE 125*  CALCIUM 8.6*     Discharge Medications:   Allergies as of 02/22/2020   No Known Allergies     Medication List    TAKE these medications   acidophilus Caps capsule Take 1 capsule by mouth daily.   aspirin 81 MG chewable tablet Chew 1 tablet (81 mg total) by mouth  2 (two) times daily.   COLLAGEN PO Take 1 capsule by mouth daily.   levonorgestrel 20 MCG/24HR IUD Commonly known as: MIRENA 1 each by Intrauterine route once.   Magnesium 250 MG Tabs Take 250 mg by mouth daily.   methocarbamol 500 MG tablet Commonly known as: ROBAXIN Take 1 tablet (500 mg total) by mouth every 6 (six) hours as needed for muscle spasms.   multivitamin with minerals Tabs tablet Take 1 tablet by mouth daily.   Omega-3 1000 MG Caps Take 1,000 mg by mouth daily.   OSTEO BI-FLEX REGULAR STRENGTH PO Take 2 tablets by mouth daily.   oxyCODONE 5 MG immediate release tablet Commonly known as: Oxy IR/ROXICODONE Take 1-2 tablets (5-10 mg total) by mouth every 4 (four) hours as needed for moderate pain (pain score 4-6).   Vitamin D 50 MCG (2000 UT) Caps Take 2,000 Units by mouth daily.            Durable Medical Equipment  (From admission,  onward)         Start     Ordered   02/21/20 1627  DME 3 n 1  Once        02/21/20 1626   02/21/20 1627  DME Walker rolling  Once       Question Answer Comment  Walker: With 5 Inch Wheels   Patient needs a walker to treat with the following condition Status post total replacement of right hip      02/21/20 1626          Diagnostic Studies: DG Pelvis Portable  Result Date: 02/21/2020 CLINICAL DATA:  Right hip arthroplasty EXAM: PORTABLE PELVIS 1-2 VIEWS COMPARISON:  02/21/2020 FINDINGS: Supine frontal view of the pelvis excludes the iliac crests by collimation. Bilateral hip arthroplasties are identified in the expected position without signs of acute complication. Postsurgical changes are seen within the soft tissues overlying the right hip. IUD again seen within the pelvis. IMPRESSION: 1. Unremarkable bilateral hip arthroplasties. Electronically Signed   By: Sharlet Salina M.D.   On: 02/21/2020 17:00   DG C-Arm 1-60 Min  Result Date: 02/21/2020 CLINICAL DATA:  ORIF right hip replacement. EXAM: OPERATIVE RIGHT HIP (WITH PELVIS IF PERFORMED)  VIEWS TECHNIQUE: Fluoroscopic spot image(s) were submitted for interpretation post-operatively. COMPARISON:  October 19, 2019. FINDINGS: Fluoro time: 26.3 seconds. Four C-arm fluoroscopic images were obtained intraoperatively and submitted for post operative interpretation. These images demonstrate postsurgical changes of right total hip arthroplasty. Partially imaged prior left total hip arthroplasty. Please see the performing provider's procedural report for further detail. IUD projects over the anatomic pelvis. IMPRESSION: Intraoperative fluoroscopic images, as detailed above. Electronically Signed   By: Feliberto Harts MD   On: 02/21/2020 16:01   DG HIP OPERATIVE UNILAT W OR W/O PELVIS RIGHT  Result Date: 02/21/2020 CLINICAL DATA:  ORIF right hip replacement. EXAM: OPERATIVE RIGHT HIP (WITH PELVIS IF PERFORMED)  VIEWS TECHNIQUE: Fluoroscopic  spot image(s) were submitted for interpretation post-operatively. COMPARISON:  October 19, 2019. FINDINGS: Fluoro time: 26.3 seconds. Four C-arm fluoroscopic images were obtained intraoperatively and submitted for post operative interpretation. These images demonstrate postsurgical changes of right total hip arthroplasty. Partially imaged prior left total hip arthroplasty. Please see the performing provider's procedural report for further detail. IUD projects over the anatomic pelvis. IMPRESSION: Intraoperative fluoroscopic images, as detailed above. Electronically Signed   By: Feliberto Harts MD   On: 02/21/2020 16:01    Disposition: Discharge disposition: 01-Home or Self Care  Follow-up Information    Kathryne Hitch, MD Follow up in 2 week(s).   Specialty: Orthopedic Surgery Contact information: [address] [place] [address] [phone]                Signed: Kathryne Hitch 02/22/2020, 7:59 AM

## 2020-02-22 NOTE — Progress Notes (Signed)
Subjective: 1 Day Post-Op Procedure(s) (LRB): RIGHT TOTAL HIP ARTHROPLASTY ANTERIOR APPROACH (Right) Patient reports pain as moderate.    Objective: Vital signs in last 24 hours: Temp:  [97.7 F (36.5 C)-98.7 F (37.1 C)] 98.7 F (37.1 C) (12/01 0511) Pulse Rate:  [58-85] 74 (12/01 0511) Resp:  [9-18] 15 (12/01 0511) BP: (88-136)/(45-70) 127/62 (12/01 0511) SpO2:  [93 %-100 %] 97 % (12/01 0004) Weight:  [68 kg] 68 kg (11/30 1029)  Intake/Output from previous day: 11/30 0701 - 12/01 0700 In: 2831.4 [P.O.:600; I.V.:1981.4; IV Piggyback:250] Out: 1625 [Urine:1425; Blood:200] Intake/Output this shift: No intake/output data recorded.  Recent Labs    02/22/20 0158  HGB 10.3*   Recent Labs    02/22/20 0158  WBC 16.5*  RBC 3.49*  HCT 30.6*  PLT 284   Recent Labs    02/22/20 0158  NA 130*  K 4.1  CL 98  CO2 22  BUN 14  CREATININE 0.69  GLUCOSE 125*  CALCIUM 8.6*   No results for input(s): LABPT, INR in the last 72 hours.  Sensation intact distally Intact pulses distally Dorsiflexion/Plantar flexion intact Incision: dressing C/D/I   Assessment/Plan: 1 Day Post-Op Procedure(s) (LRB): RIGHT TOTAL HIP ARTHROPLASTY ANTERIOR APPROACH (Right) Up with therapy Discharge home with home health    Patient's anticipated LOS is less than 2 midnights, meeting these requirements: - Younger than 87 - Lives within 1 hour of care - Has a competent adult at home to recover with post-op recover - NO history of  - Chronic pain requiring opiods  - Diabetes  - Coronary Artery Disease  - Heart failure  - Heart attack  - Stroke  - DVT/VTE  - Cardiac arrhythmia  - Respiratory Failure/COPD  - Renal failure  - Anemia  - Advanced Liver disease       Kathryne Hitch 02/22/2020, 7:56 AM

## 2020-02-22 NOTE — Op Note (Signed)
NAME: Joyce Alvarez, Joyce Alvarez MEDICAL RECORD OZ:[id number] ACCOUNT [id number] DATE OF BIRTH:1965-05-30 FACILITY: [facility] LOCATION: [facility] PHYSICIAN:Andrianna Manalang Aretha Parrot, MD  OPERATIVE REPORT  DATE OF PROCEDURE:  02/21/2020  PREOPERATIVE DIAGNOSES:  Primary osteoarthritis and degenerative joint disease, right hip.  POSTOPERATIVE DIAGNOSES:  Primary osteoarthritis and degenerative joint disease, right hip.  PROCEDURE:  Right total hip arthroplasty through direct anterior approach.  IMPLANTS:  DePuy Sector Gription acetabular component size 48, size 32+0 neutral polyethylene liner, size 0 Actis femoral component with standard offset, size 32+1 ceramic hip ball.  SURGEON:  Vanita Panda. Magnus Ivan, MD  ASSISTANT:  Richardean Canal, PA-C  ANESTHESIA:  Spinal.  ANTIBIOTICS:  Two grams IV Ancef.  BLOOD LOSS:  200 mL.  COMPLICATIONS:  None.  INDICATIONS:  The patient is a 54 year old female well known to me.  She actually has unfortunately debilitating arthritis involving both her hips.  We actually replaced her left hip in just August of this year.  She now presents to have her right hip  replaced.  She does have severe end-stage arthritis of that right hip.  She has very small femoral canal and with her first surgery, we definitely needed to make her longer.  We had the smallest components that are made in her left hip.  Fortunately with  her right hip being diseased, we will be able to get her leg lengths back out.  Having had the surgery before, she is fully aware of the risk of acute blood loss anemia, nerve and vessel injury, fracture, infection, dislocation, DVT and implant failure.   She understands our goals are to decrease pain, improve mobility and overall improve quality of life.  DESCRIPTION OF PROCEDURE:  After informed consent was obtained and appropriate right hip was marked, she was brought to the operating room and sat up on the stretcher where spinal anesthesia was obtained.  She  was then laid in supine position on a  stretcher.  A Foley catheter was placed.  I was able to assess her leg lengths and found her definitely shorter on her right side than her left.  Traction boots were placed on both her feet.  Next, she was placed supine on the Hana fracture table with a  perineal post in place and both legs in line skeletal traction devices and no traction applied.  I then assessed her leg lengths again radiographically, so we could get some good preoperative pictures as well for matching purposes.  We then prepped her  right hip with DuraPrep and sterile drapes.  A timeout was called and she was identified as correct patient, correct right hip.  I then made an incision just inferior and posterior to the anterior superior iliac spine and carried this obliquely down the  leg.  We dissected down tensor fascia lata muscle.  Tensor fascia was then divided longitudinally to proceed with direct anterior approach to the hip.  I identified and cauterized circumflex vessels and identified the hip capsule, opened up the hip  capsule in an L-type format, finding a moderate joint effusion and significant periarticular osteophytes around the lateral femoral head and neck.  I placed Cobra retractors within the joint capsule around the medial and lateral femoral neck and made our  femoral neck cut with an oscillating saw just proximal to the lesser trochanter and completed this with an osteotome.  I placed a corkscrew guide in the femoral head and removed the femoral head in its entirety and did find a wide area devoid of  cartilage.  I then placed a bent Hohmann over the medial acetabular rim and removed remnants of the acetabular labrum and other debris.  I then began reaming using a reaming system from a size 44 up to a size 48 and we needed to go line to line with our  reaming, just like we did on our opposite side.  I was then able to with the last reamer place it under direct fluoroscopy as well  as direct visualization, so we could obtain our depth of reaming, our inclination and anteversion.  I then placed the real  DePuy Sector Gription acetabular component size 48 and a 32+0 neutral polyethylene liner for that acetabular component.  Attention was then turned to the femur.  With the leg externally rotated to 120 degrees, extended and adducted, we were able to  release the lateral joint capsule and used a box-cutting osteotome to enter the femoral canal and a rongeur to lateralize, then began broaching using the Actis broaching system from just the starter broach to a 0 and the 0 actually filled the canal and  we assessed it radiographically as well.  We then trialled a standard offset femoral neck and a 32+1 hip ball.  We rolled the leg back over and up and with traction and internal rotation, reducing the pelvis and into the acetabulum.  We were pleased with  the leg length, offset, range of motion and stability, assessed radiographically and mechanically.  We then dislocated the hip and removed the trial components.  I placed the real Actis femoral component with standard offset size 0 and the real 32+1  ceramic hip ball and again reduced this in the acetabulum and we appreciated the stability, assessing this radiographically and mechanically.  I did feel like we got our leg lengths back out to equal as well.  We then irrigated the soft tissue with  normal saline solution using pulsatile lavage.  I closed the joint capsule with interrupted #1 Ethibond suture, followed by #1 Vicryl to close the tensor fascia, 0 Vicryl was used to close the subcutaneous tissue and 2-0 Vicryl was used to close the  subcuticular tissue, staples were used to reapproximate the skin.  Xeroform and Aquacel dressing was applied.  She was taken off the Hana table and taken to recovery room in stable condition with all final counts being correct.  No complications noted.   Of note, Rexene Edison, PA-C, assisted in the entire  case and assistance was crucial for facilitating all aspects of this case.  HN/NUANCE  D:02/21/2020 T:02/22/2020 JOB:[id number]

## 2020-02-27 ENCOUNTER — Telehealth: Payer: Self-pay | Admitting: Orthopaedic Surgery

## 2020-02-27 ENCOUNTER — Telehealth: Payer: Self-pay

## 2020-02-27 NOTE — Telephone Encounter (Signed)
Patient called requesting her handicap placard be sent thru the mail. Patient states sh lives in [place]. Please mail it to patient to address on demographics.

## 2020-02-27 NOTE — Telephone Encounter (Signed)
On your desk

## 2020-02-27 NOTE — Telephone Encounter (Signed)
Is there a possibility to get a handicap permit? I don't use often but it will be helpful for the next year.   Regards   Glenis E. Schweizer [phone]

## 2020-02-27 NOTE — Telephone Encounter (Signed)
Pt called and advised 

## 2020-02-27 NOTE — Telephone Encounter (Signed)
Lvm informing pt it was ready for pick up at our front desk

## 2020-02-27 NOTE — Telephone Encounter (Signed)
Placed in mail

## 2020-02-28 ENCOUNTER — Telehealth: Payer: Self-pay

## 2020-02-28 ENCOUNTER — Other Ambulatory Visit: Payer: Self-pay | Admitting: Orthopaedic Surgery

## 2020-02-28 MED ORDER — OXYCODONE HCL 5 MG PO TABS
5.0000 mg | ORAL_TABLET | ORAL | 0 refills | Status: DC | PRN
Start: 2020-02-28 — End: 2020-04-04

## 2020-02-28 NOTE — Telephone Encounter (Signed)
Pt called and advised and stated understanding  

## 2020-02-28 NOTE — Telephone Encounter (Signed)
Noted it is not uncommon to have the second hip symptoms are worse in the first.  I sent in some more oxycodone.  It is okay with still trying 3-4 Advil 2-3 times a day with meals and this will also help the pain from inflammation.  From the leg swelling standpoint, she should still be taking a baby aspirin twice a day as well as elevating her feet when she can.  She should pump her feet several times a day and can even try compressive hose from the drugstore.

## 2020-02-28 NOTE — Telephone Encounter (Signed)
Hi I am not going to lie, this surgery was a lot more challenging than last! The first 5 days home were painful. Both knee and ankle got swollen, lots of muscle spasms.   I am out of pain meds but wanted to ask If moving to advil or Tylenol makes sense? And which one would he recommend?  Thanks   Karle E. Somers [phone]

## 2020-02-29 ENCOUNTER — Other Ambulatory Visit: Payer: Self-pay | Admitting: Orthopaedic Surgery

## 2020-03-05 ENCOUNTER — Ambulatory Visit (INDEPENDENT_AMBULATORY_CARE_PROVIDER_SITE_OTHER): Payer: Managed Care, Other (non HMO) | Admitting: Physician Assistant

## 2020-03-05 ENCOUNTER — Encounter: Payer: Self-pay | Admitting: Physician Assistant

## 2020-03-05 VITALS — Ht 59.0 in | Wt 150.0 lb

## 2020-03-05 DIAGNOSIS — Z96641 Presence of right artificial hip joint: Secondary | ICD-10-CM

## 2020-03-05 NOTE — Progress Notes (Signed)
HPI: Kathye returns today status post right total hip arthroplasty 02/21/2020.  She overall is doing well but was having more discomfort with the right total hip than she did with her left total hip arthroplasty.  She is using a walker to ambulate.  She has some soreness about her knee.  She denies any fevers chills or shortness of breath she has been on aspirin twice daily for DVT prophylaxis.   Physical exam: Right hip good range of motion.  Surgical incisions healing well no signs of infection.  Staples well approximate the incision.  No seroma.  Right calf supple nontender.  Dorsiflexion plantarflexion right ankle intact.  Impression: Status post right total hip arthroplasty  Plan: She will continue work on range of motion strengthening right hip.  Staples removed Steri-Strips applied.  She will take an aspirin once daily for the next week and then discontinue as she was on no aspirin prior to surgery.  See her back in 4 weeks.

## 2020-04-04 ENCOUNTER — Encounter: Payer: Self-pay | Admitting: Physician Assistant

## 2020-04-04 ENCOUNTER — Ambulatory Visit (INDEPENDENT_AMBULATORY_CARE_PROVIDER_SITE_OTHER): Payer: Managed Care, Other (non HMO) | Admitting: Physician Assistant

## 2020-04-04 DIAGNOSIS — Z96641 Presence of right artificial hip joint: Secondary | ICD-10-CM

## 2020-04-04 DIAGNOSIS — M25559 Pain in unspecified hip: Secondary | ICD-10-CM

## 2020-04-04 MED ORDER — GABAPENTIN 100 MG PO CAPS: 300.0000 mg | ORAL_CAPSULE | Freq: Three times a day (TID) | ORAL | 1 refills | Status: DC

## 2020-04-04 MED ORDER — GABAPENTIN 100 MG PO CAPS: 300.0000 mg | ORAL_CAPSULE | Freq: Every day | ORAL | 1 refills | Status: AC

## 2020-04-04 NOTE — Progress Notes (Signed)
HPI Joyce Alvarez returns today status post right total hip arthroplasty 02/21/2020.  She states the right hip is doing well actually doing better than her left total hip arthroplasty.  She notes that the left hip she still has burning sensation about the hip and pain with deep palpation.  But no pain in the hip region of the left hip.  She feels her leg lengths are equal.  She has questions about what activities she can be involved in.  Physical exam: General well-developed well-nourished female no acute distress. Bilateral hips excellent range of motion without pain.  Dorsiflexion plantarflexion bilateral ankles intact.  Calves are supple and nontender bilaterally.  Impression: Status post right total hip arthroplasty Paresthesia left thigh secondary to left total hip arthroplasty  Plan: Place her on Neurontin 300 mg nightly.  Vitamin B6 100 mg twice daily.  Like to see her back at the 78-month postop.  From the right total hip arthroplasty obtain an AP pelvis and a lateral view of both hips at that time.  Questions were encouraged and answered at length.  She will follow-up sooner if there is any questions or concerns.

## 2020-05-10 ENCOUNTER — Telehealth: Payer: Self-pay

## 2020-05-10 NOTE — Telephone Encounter (Signed)
Will fax note

## 2020-05-10 NOTE — Telephone Encounter (Signed)
Hi!  I have a dental appointment and they are asking me for a release from doctor Magnus Ivan that it is ok for me to have dental work. It is just a regular cleaning  You can send the release to their email address  [email or web address]  and our fax number is [phone]!   Please let me know when done   Thank you  Joyce Alvarez [phone]

## 2020-05-10 NOTE — Telephone Encounter (Signed)
Called pt and informed

## 2020-05-10 NOTE — Telephone Encounter (Signed)
Note faxed.

## 2020-05-15 ENCOUNTER — Telehealth: Payer: Self-pay | Admitting: Orthopaedic Surgery

## 2020-05-15 NOTE — Telephone Encounter (Signed)
Misty with [facility] called stating they never received a fax of clearance for the pt and her appt is today @ 10:30. Misty would like to have this faxed over as soon as possible please; she also stated we could email it to be sure it come through.   [email or web address] Fax# [phone]

## 2020-05-15 NOTE — Telephone Encounter (Signed)
Faxed to provided number  

## 2020-08-15 ENCOUNTER — Ambulatory Visit: Payer: Managed Care, Other (non HMO) | Admitting: Physician Assistant

## 2020-09-17 ENCOUNTER — Ambulatory Visit: Payer: Managed Care, Other (non HMO) | Admitting: Physician Assistant

## 2020-10-02 ENCOUNTER — Ambulatory Visit (INDEPENDENT_AMBULATORY_CARE_PROVIDER_SITE_OTHER): Payer: Managed Care, Other (non HMO)

## 2020-10-02 ENCOUNTER — Encounter: Payer: Self-pay | Admitting: Orthopaedic Surgery

## 2020-10-02 ENCOUNTER — Ambulatory Visit (INDEPENDENT_AMBULATORY_CARE_PROVIDER_SITE_OTHER): Payer: Managed Care, Other (non HMO) | Admitting: Orthopaedic Surgery

## 2020-10-02 DIAGNOSIS — Z96642 Presence of left artificial hip joint: Secondary | ICD-10-CM

## 2020-10-02 DIAGNOSIS — Z96641 Presence of right artificial hip joint: Secondary | ICD-10-CM

## 2020-10-02 NOTE — Progress Notes (Signed)
HPI: Joyce Alvarez returns today follow-up status post bilateral hip replacements.  Right total hip was performed on 04/23/2019 left total hip is performed 11/18/2019.  She is overall doing well.  She states she cannot fully cross her legs.  She is walking around 12,000 steps a day.  She has no pain in either hip.  She does have some decreased sensation particularly of the left anterior hip is an present since surgery.  Review of systems: See HPI.  Physical exam: General well-developed well-nourished female no acute distress. Bilateral hips excellent range of motion of both hips.  She can cross her lower legs but is unable to to sit "lady like" with her legs crossed.  Surgical incisions are healing well no signs of infection.  Radiographs: AP pelvis lateral view of both hips: Status post bilateral total hip arthroplasties with well-seated components.  No hardware failure.  No acute fractures bony abnormalities.  Impression: Status post bilateral total hip arthroplasties  Plan: She is activities as tolerated.  She will continue work on scar tissue mobilization.  Regards to the numbness tingling in anterior thigh this should resolve with time.  Questions were encouraged and answered at length by Dr. Magnus Ivan and myself.  Follow-up as needed.

## 2022-06-02 IMAGING — RF DG HIP (WITH PELVIS) OPERATIVE*L*
1 series · 3 of 3 positions shown · non-contrast
Comparison: None.

CLINICAL DATA: Left total hip arthroplasty.

EXAM:
OPERATIVE left HIP (WITH PELVIS IF PERFORMED) 3 VIEWS
TECHNIQUE: Fluoroscopic spot image(s) were submitted for interpretation
post-operatively.
Radiation exposure index: 3.7658 mGy.

[Series 1: unknown protocol · 0.20mm/px · 3 of 3 slices shown]
[im 1/3]
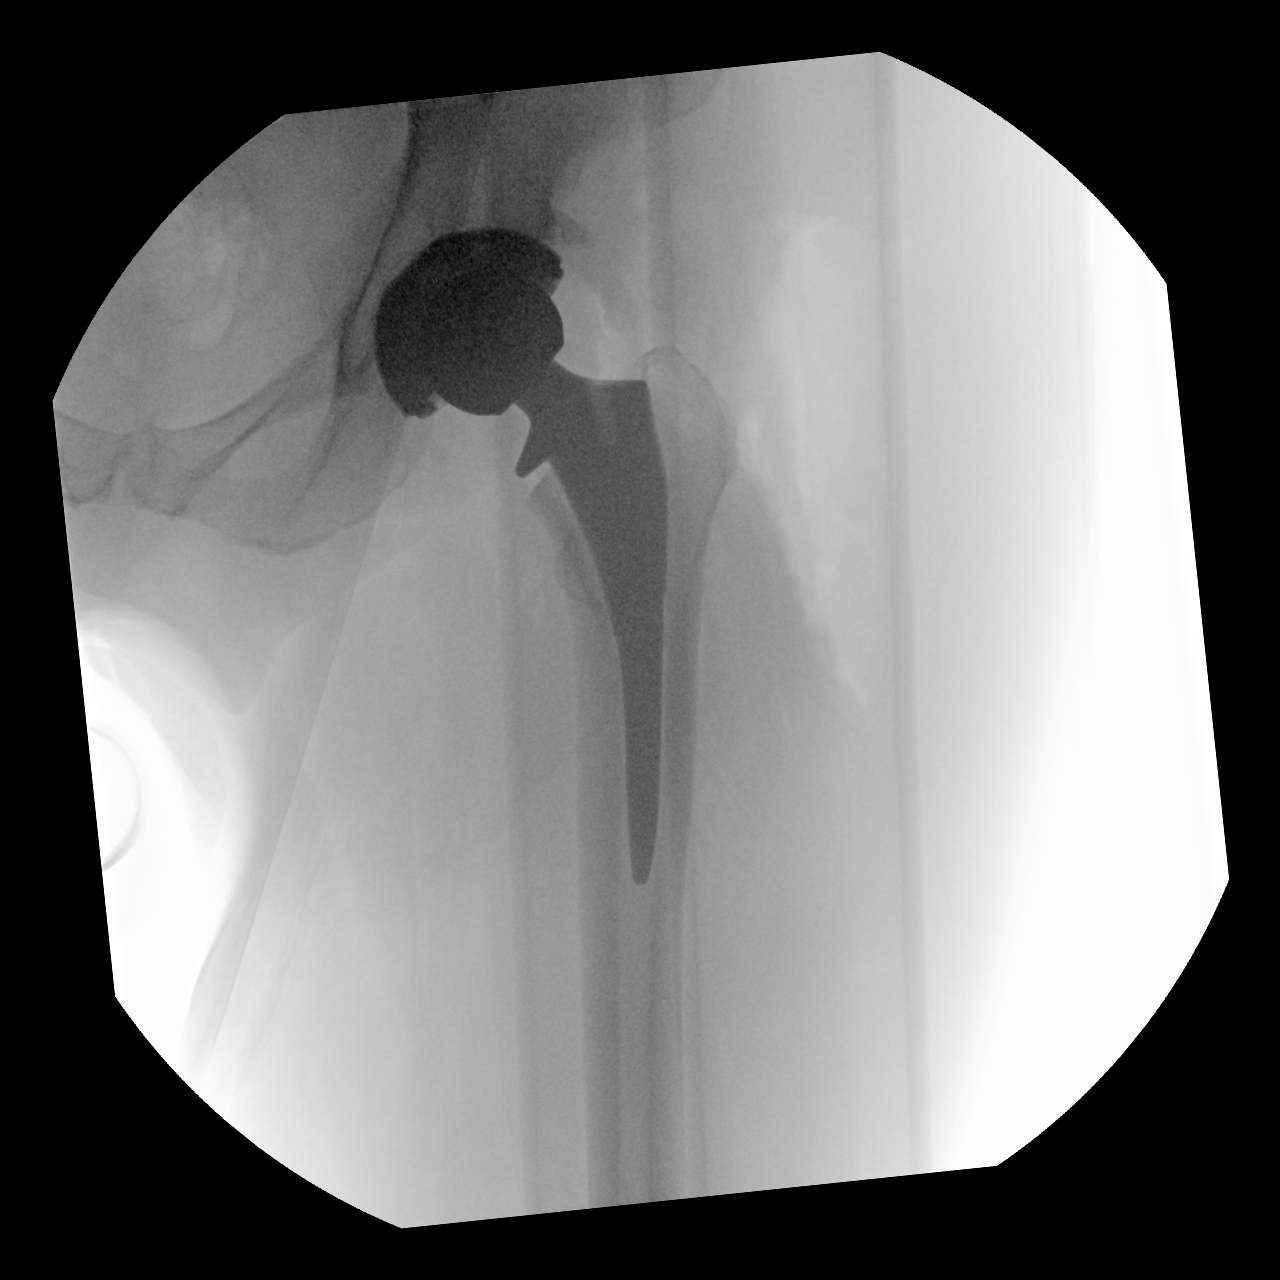
[im 2/3]
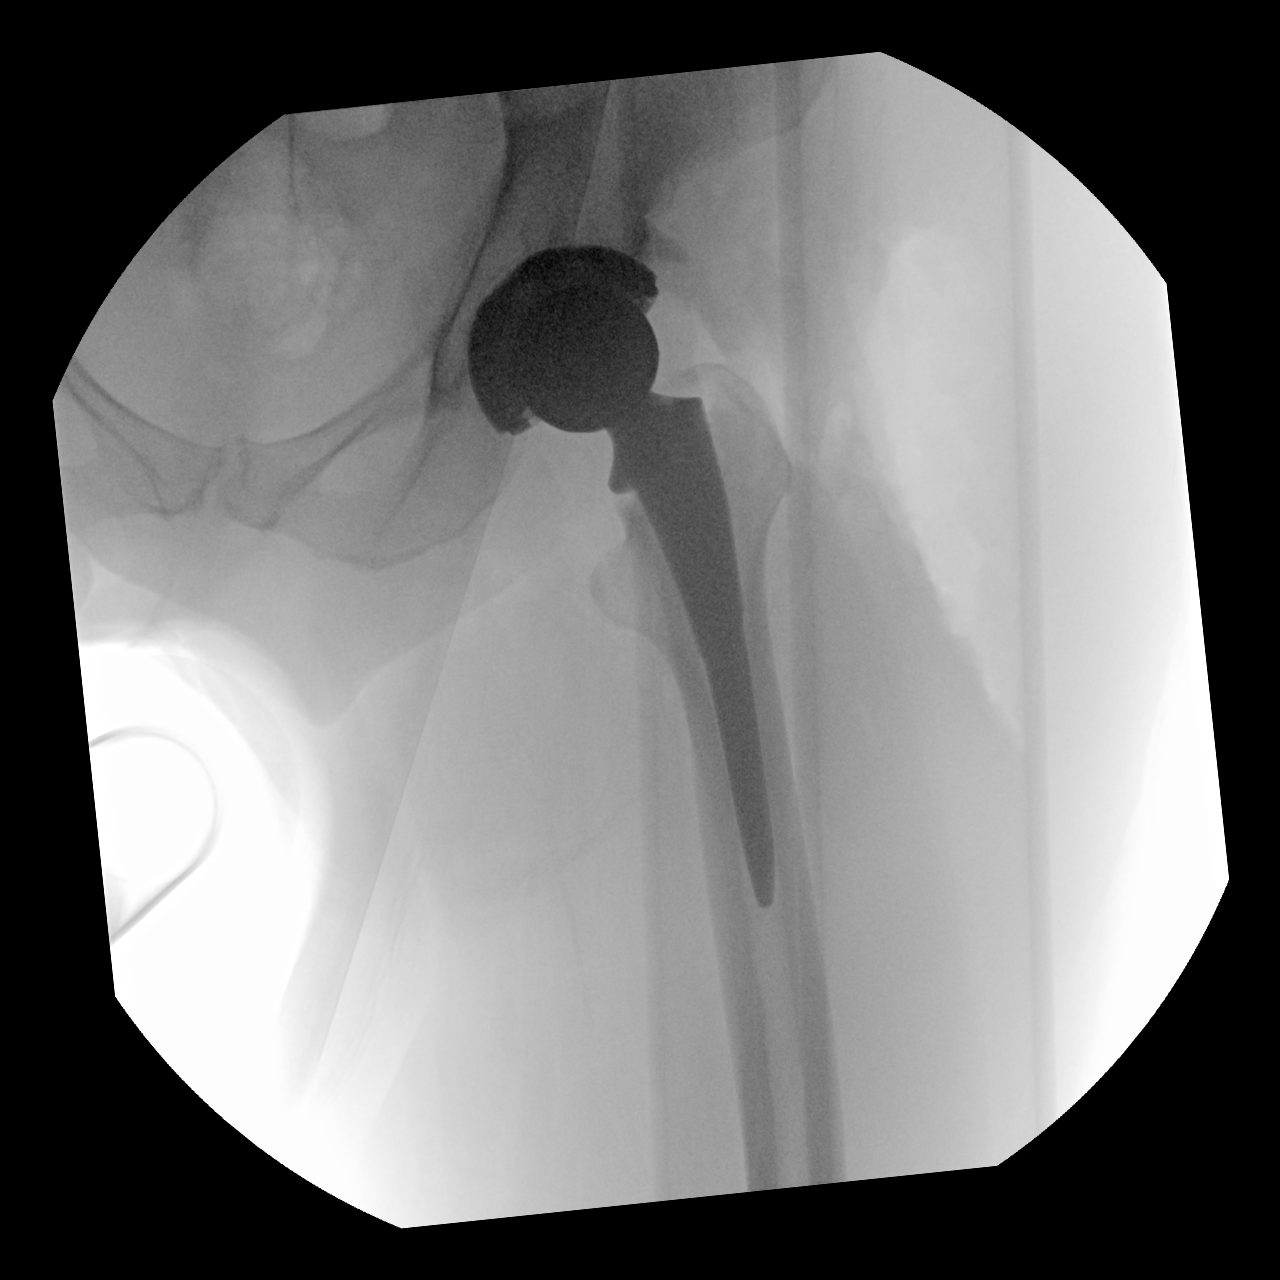
[im 3/3]
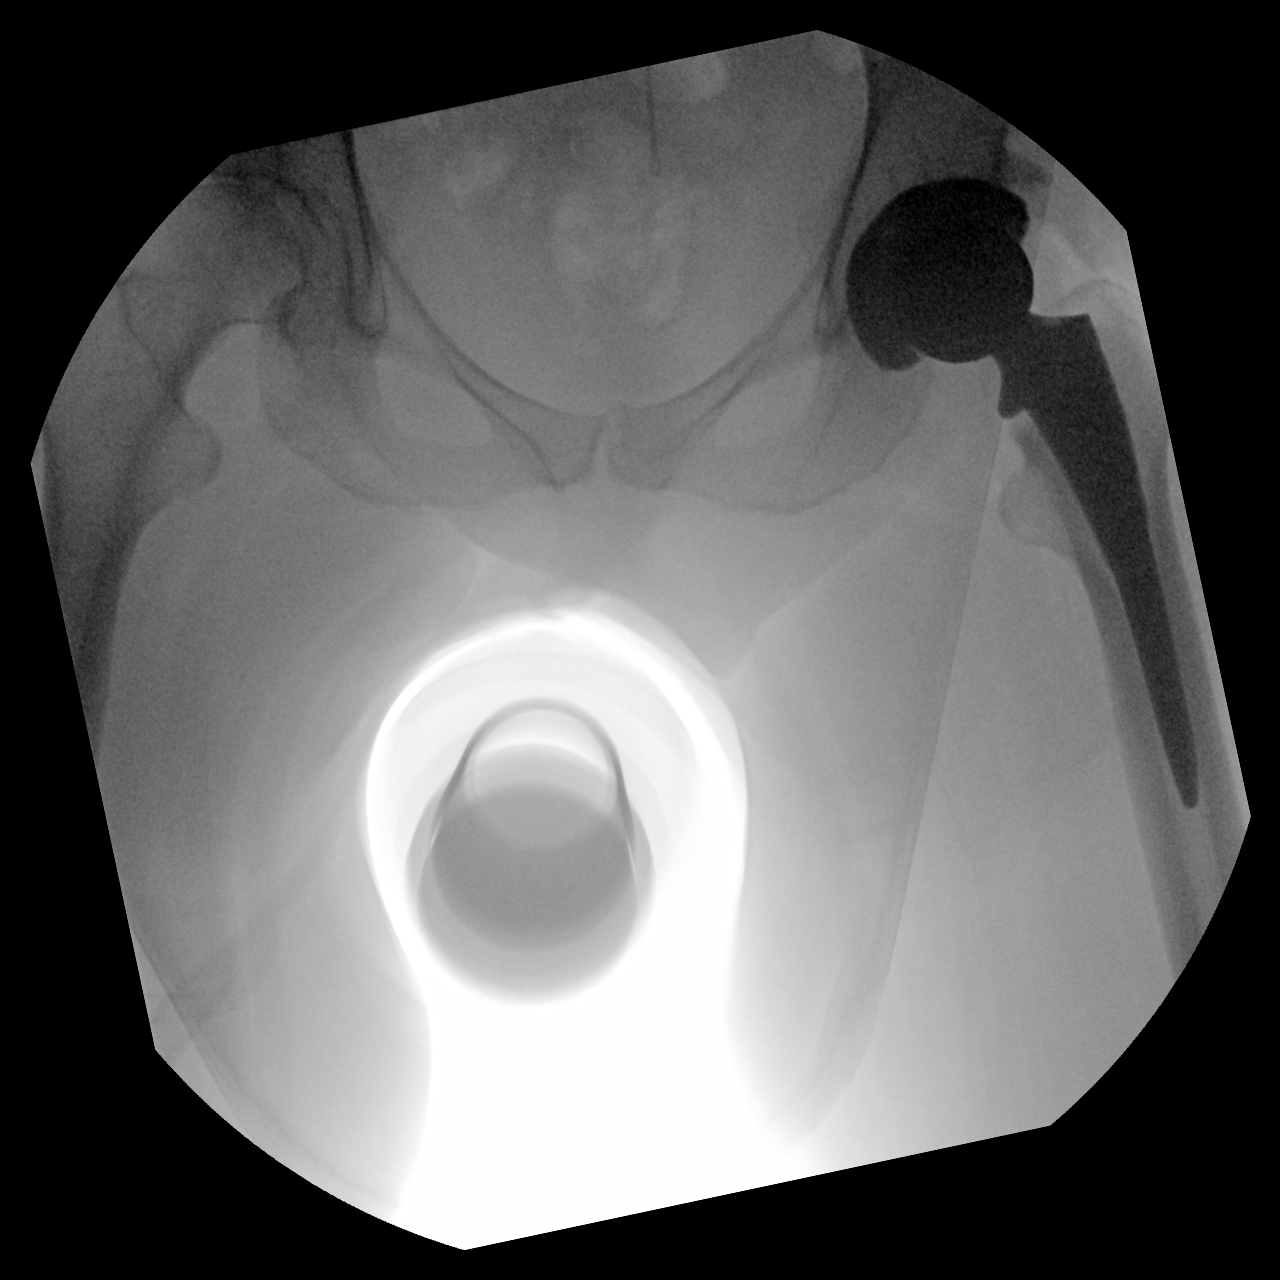

[3 of 3 positions shown; findings below may reference images not displayed]

FINDINGS: Three intraoperative fluoroscopic images demonstrate the left
femoral and acetabular components to be well situated. Expected
postoperative changes are seen in the surrounding soft tissues.
IMPRESSION: Status post left total hip arthroplasty.

## 2022-09-05 IMAGING — DX DG PORTABLE PELVIS
1 series · 1 of 1 positions shown · non-contrast
Comparison: 02/21/2020

CLINICAL DATA: Right hip arthroplasty

EXAM:
PORTABLE PELVIS 1-2 VIEWS

[pelvis ap]
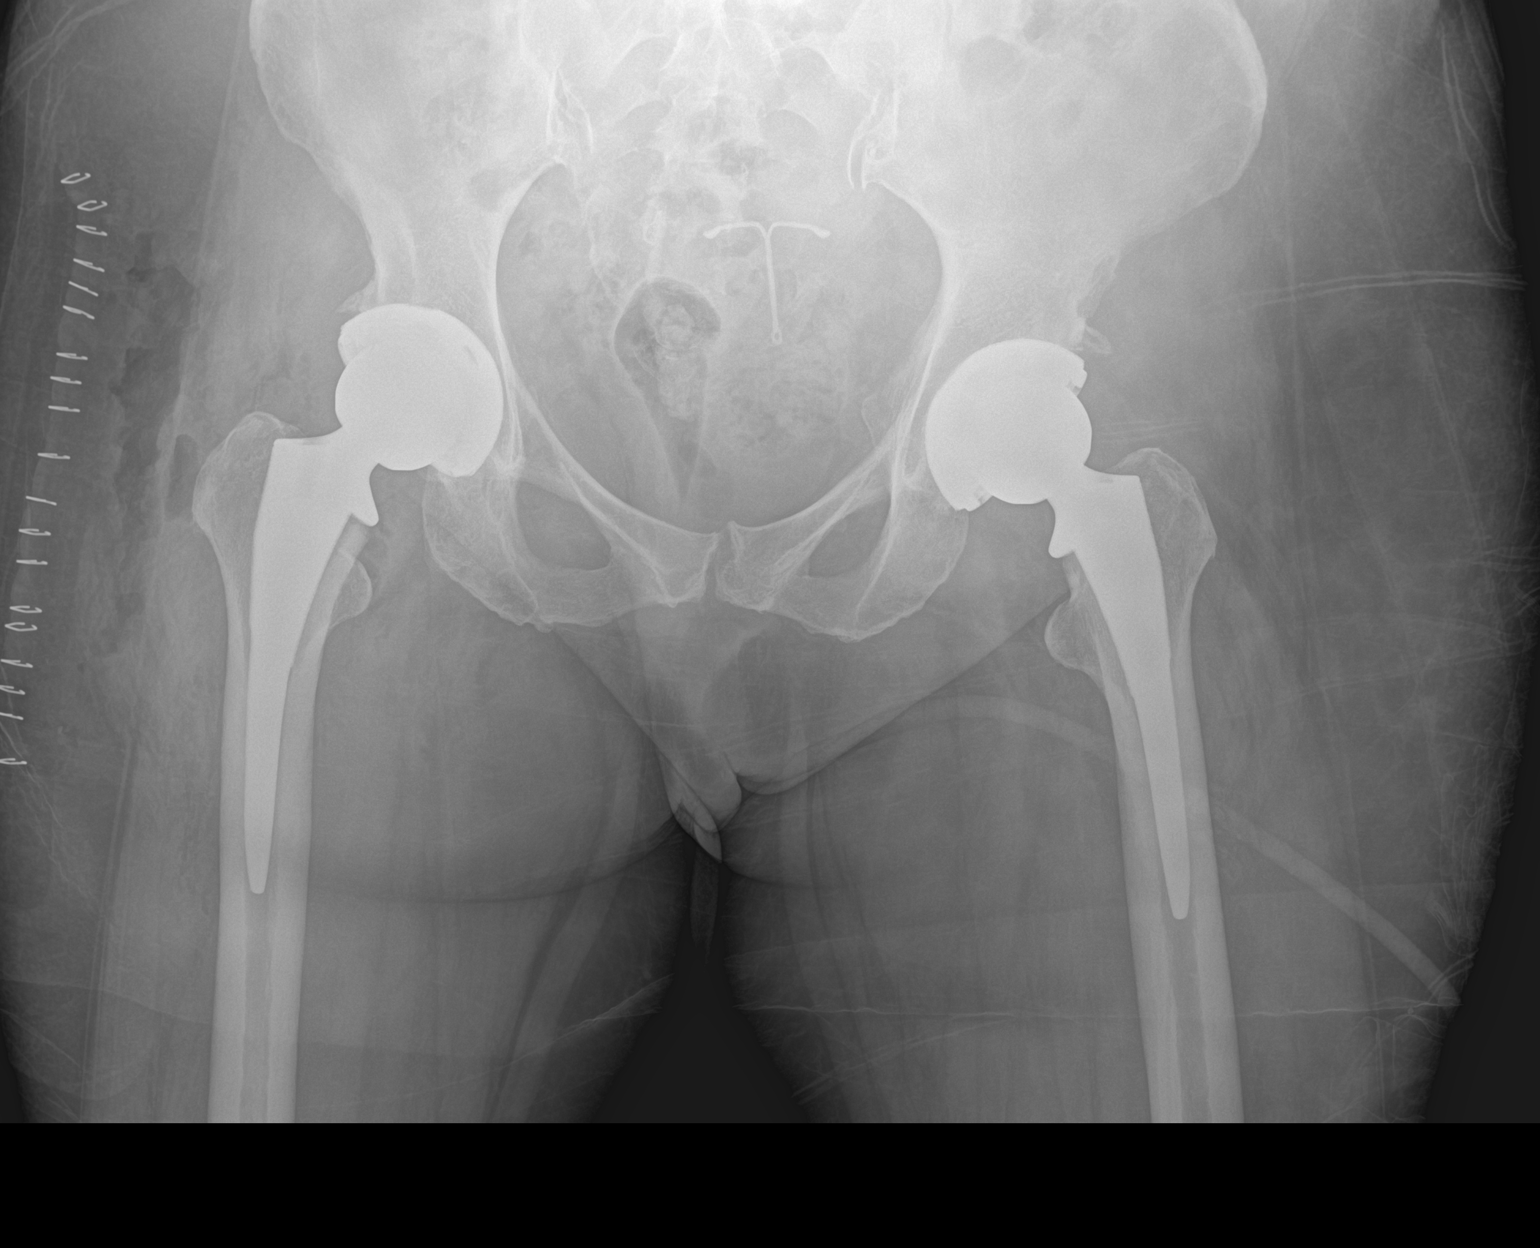

[1 of 1 positions shown; findings below may reference images not displayed]

FINDINGS: Supine frontal view of the pelvis excludes the iliac crests by
collimation. Bilateral hip arthroplasties are identified in the
expected position without signs of acute complication. Postsurgical
changes are seen within the soft tissues overlying the right hip.
IUD again seen within the pelvis.
IMPRESSION: 1. Unremarkable bilateral hip arthroplasties.
